# Patient Record
Sex: Male | Born: 1950
Health system: Southern US, Community
[De-identification: ages and names within clinical notes are randomized; demographics above are authoritative.]

## PROBLEM LIST (undated history)

## (undated) DIAGNOSIS — I10 Essential (primary) hypertension: Secondary | ICD-10-CM

## (undated) DIAGNOSIS — H40009 Preglaucoma, unspecified, unspecified eye: Secondary | ICD-10-CM

## (undated) DIAGNOSIS — H9319 Tinnitus, unspecified ear: Secondary | ICD-10-CM

## (undated) DIAGNOSIS — I2510 Atherosclerotic heart disease of native coronary artery without angina pectoris: Secondary | ICD-10-CM

## (undated) DIAGNOSIS — E559 Vitamin D deficiency, unspecified: Secondary | ICD-10-CM

## (undated) DIAGNOSIS — Z8719 Personal history of other diseases of the digestive system: Secondary | ICD-10-CM

## (undated) DIAGNOSIS — R972 Elevated prostate specific antigen [PSA]: Secondary | ICD-10-CM

## (undated) DIAGNOSIS — A159 Respiratory tuberculosis unspecified: Secondary | ICD-10-CM

## (undated) DIAGNOSIS — E785 Hyperlipidemia, unspecified: Secondary | ICD-10-CM

## (undated) DIAGNOSIS — Z905 Acquired absence of kidney: Secondary | ICD-10-CM

## (undated) HISTORY — DX: Elevated prostate specific antigen (PSA): R97.20

## (undated) HISTORY — DX: Hyperlipidemia, unspecified: E78.5

## (undated) HISTORY — DX: Acquired absence of kidney: Z90.5

## (undated) HISTORY — PX: UPPER GASTROINTESTINAL ENDOSCOPY: SHX188

## (undated) HISTORY — PX: COLONOSCOPY: SHX174

## (undated) HISTORY — DX: Vitamin D deficiency, unspecified: E55.9

## (undated) HISTORY — DX: Tinnitus, unspecified ear: H93.19

## (undated) HISTORY — DX: Preglaucoma, unspecified, unspecified eye: H40.009

## (undated) HISTORY — PX: OTHER SURGICAL HISTORY: SHX169

---
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
## 1978-05-02 HISTORY — PX: NEPHRECTOMY: SHX65

## 2003-11-23 ENCOUNTER — Inpatient Hospital Stay (HOSPITAL_COMMUNITY): Admission: EM | Admit: 2003-11-23 | Discharge: 2003-11-25 | Payer: Self-pay | Admitting: Emergency Medicine

## 2005-01-23 ENCOUNTER — Emergency Department (HOSPITAL_COMMUNITY): Admission: EM | Admit: 2005-01-23 | Discharge: 2005-01-23 | Payer: Self-pay | Admitting: Emergency Medicine

## 2009-12-22 ENCOUNTER — Observation Stay (HOSPITAL_COMMUNITY): Admission: EM | Admit: 2009-12-22 | Discharge: 2009-12-24 | Payer: Self-pay | Admitting: Emergency Medicine

## 2010-07-15 LAB — CK TOTAL AND CKMB (NOT AT ARMC)
Relative Index: 1.3 (ref 0.0–2.5)
Total CK: 280 U/L — ABNORMAL HIGH (ref 7–232)

## 2010-07-15 LAB — CBC
HCT: 45.8 % (ref 39.0–52.0)
MCH: 32.1 pg (ref 26.0–34.0)
MCHC: 35.2 g/dL (ref 30.0–36.0)
MCV: 91.2 fL (ref 78.0–100.0)
Platelets: 145 10*3/uL — ABNORMAL LOW (ref 150–400)
RBC: 5.03 MIL/uL (ref 4.22–5.81)
RDW: 13 % (ref 11.5–15.5)
RDW: 13.1 % (ref 11.5–15.5)

## 2010-07-15 LAB — CARDIAC PANEL(CRET KIN+CKTOT+MB+TROPI)
CK, MB: 2.6 ng/mL (ref 0.3–4.0)
Relative Index: 1.3 (ref 0.0–2.5)
Relative Index: 1.6 (ref 0.0–2.5)

## 2010-07-15 LAB — POCT CARDIAC MARKERS: CKMB, poc: 1.4 ng/mL (ref 1.0–8.0)

## 2010-07-15 LAB — BASIC METABOLIC PANEL
CO2: 30 mEq/L (ref 19–32)
Creatinine, Ser: 0.75 mg/dL (ref 0.4–1.5)
Glucose, Bld: 114 mg/dL — ABNORMAL HIGH (ref 70–99)
Sodium: 140 mEq/L (ref 135–145)

## 2010-07-15 LAB — APTT: aPTT: 26 seconds (ref 24–37)

## 2010-07-15 LAB — POCT I-STAT, CHEM 8
Calcium, Ion: 1.13 mmol/L (ref 1.12–1.32)
Chloride: 107 mEq/L (ref 96–112)
Creatinine, Ser: 0.8 mg/dL (ref 0.4–1.5)
HCT: 47 % (ref 39.0–52.0)
Hemoglobin: 16 g/dL (ref 13.0–17.0)
Sodium: 140 mEq/L (ref 135–145)
TCO2: 25 mmol/L (ref 0–100)

## 2010-07-15 LAB — HEPATIC FUNCTION PANEL
ALT: 33 U/L (ref 0–53)
Albumin: 3.7 g/dL (ref 3.5–5.2)
Alkaline Phosphatase: 55 U/L (ref 39–117)

## 2010-07-15 LAB — LIPID PANEL
Cholesterol: 192 mg/dL (ref 0–200)
HDL: 29 mg/dL — ABNORMAL LOW (ref >39)
Total CHOL/HDL Ratio: 6.6 RATIO
Triglycerides: 339 mg/dL — ABNORMAL HIGH (ref <150)

## 2010-07-15 LAB — HEMOGLOBIN A1C: Hgb A1c MFr Bld: 5.7 % — ABNORMAL HIGH (ref <5.7)

## 2010-07-15 LAB — D-DIMER, QUANTITATIVE: D-Dimer, Quant: 0.36 ug/mL-FEU (ref 0.00–0.48)

## 2010-07-15 LAB — TROPONIN I: Troponin I: 0.01 ng/mL (ref 0.00–0.06)
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
## 2013-04-27 ENCOUNTER — Ambulatory Visit (INDEPENDENT_AMBULATORY_CARE_PROVIDER_SITE_OTHER): Payer: 59 | Admitting: Emergency Medicine

## 2013-04-27 VITALS — BP 136/80 | HR 87 | Temp 98.1°F | Resp 16 | Ht 69.75 in | Wt 207.0 lb

## 2013-04-27 DIAGNOSIS — R05 Cough: Secondary | ICD-10-CM

## 2013-04-27 DIAGNOSIS — R52 Pain, unspecified: Secondary | ICD-10-CM

## 2013-04-27 LAB — POCT CBC
Granulocyte percent: 67.6 %G (ref 37–80)
MID (cbc): 0.5 (ref 0–0.9)
MPV: 10.6 fL (ref 0–99.8)
POC Granulocyte: 4.8 (ref 2–6.9)
POC MID %: 7.1 %M (ref 0–12)
Platelet Count, POC: 168 10*3/uL (ref 142–424)
RBC: 4.91 M/uL (ref 4.69–6.13)

## 2013-04-27 MED ORDER — OSELTAMIVIR PHOSPHATE 75 MG PO CAPS: 75.0000 mg | ORAL_CAPSULE | Freq: Two times a day (BID) | ORAL | Status: DC

## 2013-04-27 NOTE — Patient Instructions (Signed)

## 2013-04-27 NOTE — Progress Notes (Addendum)
Subjective:    Patient ID: Shane Fowler, male    DOB: June 13, 1950, 62 y.o.   MRN: [id number]  Cough Associated symptoms include chills, headaches and a sore throat. Pertinent negatives include no ear pain, fever, myalgias, rhinorrhea or shortness of breath.  Emesis  Associated symptoms include chills, coughing and headaches. Pertinent negatives include no diarrhea, fever or myalgias.   This chart was scribed for Collene Gobble, MD, by Ellin Mayhew, ED Scribe. This patient was seen in room 04 and the patient's care was started at 11:33 AM.  HPI Comments: Shane Fowler is a 62 y.o. male who presents to the [facility] complaining of progressively worsening influenza symptoms including a sore throat, hot flashes, HA and chills. He reports that he began feeling a sore throat as the initial symptom two days ago. He worked later that night outside, and states that the cold night worsened his symptoms. He has taken tamiflu with minimal relief. Patient explains that following that night, his symptoms have progressively worsened to include hot flashes and chills but no fever or myalgias. Patient also reports that he has been vomiting on and off for two days. After eating, he reports feeling worse and was unable to work yesterday. Patient reports having taken the flu-shot earlier this year. He is a non-smoker.  History reviewed. No pertinent past medical history.  History reviewed. No pertinent past surgical history.  History reviewed. No pertinent family history.  History   Social History  . Marital Status: Divorced    Spouse Name: N/A    Number of Children: N/A  . Years of Education: N/A   Occupational History  . Not on file.   Social History Main Topics  . Smoking status: Never Smoker   . Smokeless tobacco: Not on file  . Alcohol Use: Yes  . Drug Use: No  . Sexual Activity: Not on file   Other Topics Concern  . Not on file   Social History  Narrative  . No narrative on file    No Known Allergies  There are no active problems to display for this patient.   Results for orders placed during the hospital encounter of 12/22/09  MRSA PCR SCREENING      Result Value Range   MRSA by PCR    NEGATIVE   Value: NEGATIVE            The GeneXpert MRSA Assay (FDA     approved for NASAL specimens     only), is one component of a     comprehensive MRSA colonization     surveillance program. It is not     intended to diagnose MRSA     infection nor to guide or     monitor treatment for     MRSA infections.  CBC      Result Value Range   WBC 5.2  4.0 - 10.5 K/uL   RBC 5.03  4.22 - 5.81 MIL/uL   Hemoglobin 16.0  13.0 - 17.0 g/dL   HCT 78.2  95.6 - 21.3 %   MCV 89.5  78.0 - 100.0 fL   MCH 31.8  26.0 - 34.0 pg   MCHC 35.6  30.0 - 36.0 g/dL   RDW 08.6  57.8 - 46.9 %   Platelets 145 (*) 150 - 400 K/uL  CK TOTAL AND CKMB      Result Value Range   Total CK 280 (*) 7 - 232 U/L  CK, MB 3.5  0.3 - 4.0 ng/mL   Relative Index 1.3  0.0 - 2.5  HEPATIC FUNCTION PANEL      Result Value Range   Total Protein 7.3  6.0 - 8.3 g/dL   Albumin 3.7  3.5 - 5.2 g/dL   AST 33  0 - 37 U/L   ALT 33  0 - 53 U/L   Alkaline Phosphatase 55  39 - 117 U/L   Total Bilirubin 0.6  0.3 - 1.2 mg/dL   Bilirubin, Direct <1.6  0.0 - 0.3 mg/dL   Indirect Bilirubin NOT CALCULATED  0.3 - 0.9 mg/dL  HEMOGLOBIN X0R      Result Value Range   Hemoglobin A1C   (*) <5.7 %   Value: 5.7     (NOTE)                                                                       According to the ADA Clinical Practice Recommendations for 2011, when HbA1c is used as a screening test:   >=6.5%   Diagnostic of Diabetes Mellitus           (if abnormal result      is confirmed)  5.7-6.4%   Increased risk of developing Diabetes Mellitus  References:Diagnosis and Classification of Diabetes Mellitus,Diabetes Care,2011,34(Suppl 1):S62-S69 and Standards of Medical Care in         Diabetes -  2011,Diabetes Care,2011,34      (Suppl 1):S11-S61.   Mean Plasma Glucose 117 (*) <117 mg/dL  LIPASE, BLOOD      Result Value Range   Lipase 29  11 - 59 U/L  PROTIME-INR      Result Value Range   Prothrombin Time 13.4  11.6 - 15.2 seconds   INR 1.00  0.00 - 1.49  APTT      Result Value Range   aPTT 26  24 - 37 seconds  TROPONIN I      Result Value Range   Troponin I    0.00 - 0.06 ng/mL   Value: 0.01            NO INDICATION OF     MYOCARDIAL INJURY.  TSH      Result Value Range   TSH 1.604  0.350 - 4.500 uIU/mL  CARDIAC PANEL(CRET KIN+CKTOT+MB+TROPI)      Result Value Range   Total CK 202  7 - 232 U/L   CK, MB 2.6  0.3 - 4.0 ng/mL   Troponin I    0.00 - 0.06 ng/mL   Value: 0.01            NO INDICATION OF     MYOCARDIAL INJURY.   Relative Index 1.3  0.0 - 2.5  LIPID PANEL      Result Value Range   Cholesterol    0 - 200 mg/dL   Value: 604            ATP III CLASSIFICATION:      <200     mg/dL   Desirable      540-981  mg/dL   Borderline High      >=240    mg/dL   High  Triglycerides 339 (*) <150 mg/dL   HDL 29 (*) >09 mg/dL   Total CHOL/HDL Ratio 6.6     VLDL 68 (*) 0 - 40 mg/dL   LDL Cholesterol    0 - 99 mg/dL   Value: 95            Total Cholesterol/HDL:CHD Risk     Coronary Heart Disease Risk Table                         Men   Women      1/2 Average Risk   3.4   3.3      Average Risk       5.0   4.4      2 X Average Risk   9.6   7.1      3 X Average Risk  23.4   11.0                Use the calculated Patient Ratio     above and the CHD Risk Table     to determine the patient's CHD Risk.                ATP III CLASSIFICATION (LDL):      <100     mg/dL   Optimal      811-914  mg/dL   Near or Above                        Optimal      130-159  mg/dL   Borderline      782-956  mg/dL   High      >213     mg/dL   Very High  CARDIAC PANEL(CRET KIN+CKTOT+MB+TROPI)      Result Value Range   Total CK 176  7 - 232 U/L   CK, MB 2.8  0.3 - 4.0  ng/mL   Troponin I    0.00 - 0.06 ng/mL   Value: 0.02            NO INDICATION OF     MYOCARDIAL INJURY.   Relative Index 1.6  0.0 - 2.5  D-DIMER, QUANTITATIVE      Result Value Range   D-Dimer, Quant    0.00 - 0.48 ug/mL-FEU   Value: 0.36            AT THE INHOUSE ESTABLISHED CUTOFF     VALUE OF 0.48 ug/mL FEU,     THIS ASSAY HAS BEEN DOCUMENTED     IN THE LITERATURE TO HAVE     A SENSITIVITY AND NEGATIVE     PREDICTIVE VALUE OF AT LEAST     98 TO 99%.  THE TEST RESULT     SHOULD BE CORRELATED WITH     AN ASSESSMENT OF THE CLINICAL     PROBABILITY OF DVT / VTE.  BASIC METABOLIC PANEL      Result Value Range   Sodium 140  135 - 145 mEq/L   Potassium 3.8  3.5 - 5.1 mEq/L   Chloride 105  96 - 112 mEq/L   CO2 30  19 - 32 mEq/L   Glucose, Bld 114 (*) 70 - 99 mg/dL   BUN 11  6 - 23 mg/dL   Creatinine, Ser 0.86  0.4 - 1.5 mg/dL   Calcium 9.4  8.4 - 57.8 mg/dL   GFR calc non Af Amer >60  >60 mL/min  GFR calc Af Amer    >60 mL/min   Value: >60            The eGFR has been calculated     using the MDRD equation.     This calculation has not been     validated in all clinical     situations.     eGFR's persistently     <60 mL/min signify     possible Chronic Kidney Disease.  CBC      Result Value Range   WBC 6.1  4.0 - 10.5 K/uL   RBC 5.02  4.22 - 5.81 MIL/uL   Hemoglobin 16.1  13.0 - 17.0 g/dL   HCT 41.3  24.4 - 01.0 %   MCV 91.2  78.0 - 100.0 fL   MCH 32.1  26.0 - 34.0 pg   MCHC 35.2  30.0 - 36.0 g/dL   RDW 27.2  53.6 - 64.4 %   Platelets 148 (*) 150 - 400 K/uL  POCT I-STAT, CHEM 8      Result Value Range   Sodium 140  135 - 145 mEq/L   Potassium 4.0  3.5 - 5.1 mEq/L   Chloride 107  96 - 112 mEq/L   BUN 14  6 - 23 mg/dL   Creatinine, Ser 0.8  0.4 - 1.5 mg/dL   Glucose, Bld 034 (*) 70 - 99 mg/dL   Calcium, Ion 7.42  5.95 - 1.32 mmol/L   TCO2 25  0 - 100 mmol/L   Hemoglobin 16.0  13.0 - 17.0 g/dL   HCT 63.8  75.6 - 43.3 %  POCT CARDIAC MARKERS      Result Value  Range   Myoglobin, poc 91.3  12 - 200 ng/mL   CKMB, poc 1.6  1.0 - 8.0 ng/mL   Troponin i, poc <0.05  0.00 - 0.09 ng/mL   Comment       Value:            TROPONIN VALUES IN THE RANGE     OF 0.00-0.09 ng/mL SHOW     NO INDICATION OF     MYOCARDIAL INJURY.                PERSISTENTLY INCREASED TROPONIN     VALUES IN THE RANGE OF 0.10-0.24     ng/mL CAN BE SEEN IN:           -UNSTABLE ANGINA           -CONGESTIVE HEART FAILURE           -MYOCARDITIS           -CHEST TRAUMA           -ARRYHTHMIAS           -LATE PRESENTING MI           -COPD       CLINICAL FOLLOW-UP RECOMMENDED.                TROPONIN VALUES >=0.25 ng/mL     INDICATE POSSIBLE MYOCARDIAL     ISCHEMIA. SERIAL TESTING     RECOMMENDED.  POCT CARDIAC MARKERS      Result Value Range   Myoglobin, poc 63.5  12 - 200 ng/mL   CKMB, poc 1.4  1.0 - 8.0 ng/mL   Troponin i, poc <0.05  0.00 - 0.09 ng/mL   Comment       Value:            TROPONIN  VALUES IN THE RANGE     OF 0.00-0.09 ng/mL SHOW     NO INDICATION OF     MYOCARDIAL INJURY.                PERSISTENTLY INCREASED TROPONIN     VALUES IN THE RANGE OF 0.10-0.24     ng/mL CAN BE SEEN IN:           -UNSTABLE ANGINA           -CONGESTIVE HEART FAILURE           -MYOCARDITIS           -CHEST TRAUMA           -ARRYHTHMIAS           -LATE PRESENTING MI           -COPD       CLINICAL FOLLOW-UP RECOMMENDED.                TROPONIN VALUES >=0.25 ng/mL     INDICATE POSSIBLE MYOCARDIAL     ISCHEMIA. SERIAL TESTING     RECOMMENDED.    No diagnosis found.  No orders of the defined types were placed in this encounter.    BP 136/80  Pulse 87  Temp(Src) 98.1 F (36.7 C) (Oral)  Resp 16  Ht 5' 9.75" (1.772 m)  Wt 207 lb (93.895 kg)  BMI 29.90 kg/m2  SpO2 96%  Review of Systems  Constitutional: Positive for chills and diaphoresis. Negative for fever.  HENT: Positive for congestion and sore throat. Negative for ear pain, rhinorrhea and sinus pressure.     Respiratory: Positive for cough. Negative for shortness of breath.   Gastrointestinal: Positive for nausea and vomiting. Negative for diarrhea.  Musculoskeletal: Negative for myalgias and neck pain.  Neurological: Positive for headaches. Negative for weakness and light-headedness.  A complete 10 system review of systems was obtained and all systems are negative except as noted in the HPI and PMH.   Objective:   Physical Exam  CONSTITUTIONAL: Well developed/well nourished HEAD: Normocephalic/atraumatic EYES: EOMI/PERRL ENMT: Oropharyngeal erythema without exudate noted. Ears normal. NECK: supple no meningeal signs. No nodes. SPINE:entire spine nontender CV: S1/S2 noted, no murmurs/rubs/gallops noted LUNGS: Lungs are clear to auscultation bilaterally, no apparent distress ABDOMEN: soft, nontender, no rebound or guarding GU:no cva tenderness NEURO: Pt is awake/alert, moves all extremitiesx4 EXTREMITIES: pulses normal, full ROM SKIN: warm, color normal PSYCH: no abnormalities of mood noted Results for orders placed in visit on 04/27/13  POCT INFLUENZA A/B      Result Value Range   Influenza A, POC Negative     Influenza B, POC Negative     Results for orders placed in visit on 04/27/13  POCT INFLUENZA A/B      Result Value Range   Influenza A, POC Negative     Influenza B, POC Negative    POCT CBC      Result Value Range   WBC 7.1  4.6 - 10.2 K/uL   Lymph, poc 1.8  0.6 - 3.4   POC LYMPH PERCENT 25.3  10 - 50 %L   MID (cbc) 0.5  0 - 0.9   POC MID % 7.1  0 - 12 %M   POC Granulocyte 4.8  2 - 6.9   Granulocyte percent 67.6  37 - 80 %G   RBC 4.91  4.69 - 6.13 M/uL   Hemoglobin 15.1  14.1 - 18.1 g/dL   HCT, POC 46.9  43.5 - 53.7 %   MCV 95.5  80 - 97 fL   MCH, POC 30.8  27 - 31.2 pg   MCHC 32.2  31.8 - 35.4 g/dL   RDW, POC 16.1     Platelet Count, POC 168  142 - 424 K/uL   MPV 10.6  0 - 99.8 fL   Assessment & Plan:   Patient has a normal white count with all flu  symptoms despite a negative flu swab. We'll treat with Tamiflu I personally performed the services described in this documentation, which was scribed in my presence. The recorded information has been reviewed and is accurate.

## 2015-11-30 LAB — HM HIV SCREENING LAB: HM HIV Screening: NEGATIVE

## 2015-11-30 LAB — HM HEPATITIS C SCREENING LAB: HM Hepatitis Screen: NEGATIVE

## 2016-11-22 LAB — MICROALBUMIN, URINE: Microalb, Ur: 3.46

## 2017-07-12 LAB — HM COLONOSCOPY

## 2018-01-06 DIAGNOSIS — L84 Corns and callosities: Secondary | ICD-10-CM | POA: Diagnosis not present

## 2018-01-23 LAB — BASIC METABOLIC PANEL
BUN: 17 (ref 4–21)
Creatinine: 0.9 (ref 0.6–1.3)
Glucose: 96
Potassium: 4 (ref 3.4–5.3)
Sodium: 139 (ref 137–147)

## 2018-01-23 LAB — HEPATIC FUNCTION PANEL
ALT: 33 (ref 10–40)
AST: 26 (ref 14–40)
Alkaline Phosphatase: 66 (ref 25–125)
Bilirubin, Direct: 0.1 (ref 0.01–0.4)
Bilirubin, Total: 0.6

## 2018-01-23 LAB — LIPID PANEL
Cholesterol: 188 (ref 0–200)
HDL: 30 — AB (ref 35–70)
LDL Cholesterol: 107
Triglycerides: 257 — AB (ref 40–160)

## 2018-01-23 LAB — CBC AND DIFFERENTIAL
HCT: 44 (ref 41–53)
Hemoglobin: 15.1 (ref 13.5–17.5)
Neutrophils Absolute: 1
Platelets: 164 (ref 150–399)
WBC: 5.3

## 2018-01-23 LAB — VITAMIN D 25 HYDROXY (VIT D DEFICIENCY, FRACTURES): Vit D, 25-Hydroxy: 11.68

## 2018-01-23 LAB — PSA: PSA: 4.62

## 2018-08-14 DIAGNOSIS — M79672 Pain in left foot: Secondary | ICD-10-CM | POA: Diagnosis not present

## 2018-08-14 DIAGNOSIS — B351 Tinea unguium: Secondary | ICD-10-CM | POA: Diagnosis not present

## 2018-08-14 DIAGNOSIS — M79671 Pain in right foot: Secondary | ICD-10-CM | POA: Diagnosis not present

## 2018-08-14 DIAGNOSIS — L84 Corns and callosities: Secondary | ICD-10-CM | POA: Diagnosis not present

## 2018-09-13 DIAGNOSIS — M5432 Sciatica, left side: Secondary | ICD-10-CM | POA: Diagnosis not present

## 2018-09-13 DIAGNOSIS — H9313 Tinnitus, bilateral: Secondary | ICD-10-CM | POA: Diagnosis not present

## 2018-09-20 ENCOUNTER — Encounter: Payer: Self-pay | Admitting: Internal Medicine

## 2018-09-20 ENCOUNTER — Ambulatory Visit (INDEPENDENT_AMBULATORY_CARE_PROVIDER_SITE_OTHER): Payer: Medicare Other | Admitting: Internal Medicine

## 2018-09-20 DIAGNOSIS — H9319 Tinnitus, unspecified ear: Secondary | ICD-10-CM

## 2018-09-20 DIAGNOSIS — R51 Headache: Secondary | ICD-10-CM

## 2018-09-20 DIAGNOSIS — R519 Headache, unspecified: Secondary | ICD-10-CM
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
## 2018-09-21 ENCOUNTER — Encounter: Payer: Self-pay | Admitting: Internal Medicine

## 2018-09-21 ENCOUNTER — Ambulatory Visit (INDEPENDENT_AMBULATORY_CARE_PROVIDER_SITE_OTHER): Payer: Medicare Other | Admitting: Internal Medicine

## 2018-09-21 ENCOUNTER — Other Ambulatory Visit: Payer: Self-pay

## 2018-09-21 VITALS — BP 121/74 | HR 67 | Temp 98.2°F | Resp 16 | Ht 70.0 in | Wt 204.0 lb

## 2018-09-21 DIAGNOSIS — R519 Headache, unspecified: Secondary | ICD-10-CM | POA: Insufficient documentation

## 2018-09-21 DIAGNOSIS — G4452 New daily persistent headache (NDPH): Secondary | ICD-10-CM | POA: Diagnosis not present

## 2018-09-21 DIAGNOSIS — E782 Mixed hyperlipidemia: Secondary | ICD-10-CM | POA: Insufficient documentation

## 2018-09-21 DIAGNOSIS — E785 Hyperlipidemia, unspecified: Secondary | ICD-10-CM

## 2018-09-21 DIAGNOSIS — I10 Essential (primary) hypertension: Secondary | ICD-10-CM | POA: Diagnosis not present

## 2018-09-21 LAB — COMPREHENSIVE METABOLIC PANEL
ALT: 21 U/L (ref 0–53)
AST: 17 U/L (ref 0–37)
Albumin: 4.2 g/dL (ref 3.5–5.2)
Alkaline Phosphatase: 59 U/L (ref 39–117)
BUN: 28 mg/dL — ABNORMAL HIGH (ref 6–23)
CO2: 27 mEq/L (ref 19–32)
Calcium: 9.3 mg/dL (ref 8.4–10.5)
Chloride: 104 mEq/L (ref 96–112)
Creatinine, Ser: 0.87 mg/dL (ref 0.40–1.50)
GFR: 87.82 mL/min (ref >60.00)
Glucose, Bld: 102 mg/dL — ABNORMAL HIGH (ref 70–99)
Potassium: 4.2 mEq/L (ref 3.5–5.1)
Sodium: 140 mEq/L (ref 135–145)
Total Bilirubin: 0.4 mg/dL (ref 0.2–1.2)
Total Protein: 7.3 g/dL (ref 6.0–8.3)

## 2018-09-21 LAB — CBC WITH DIFFERENTIAL/PLATELET
Basophils Absolute: 0.1 10*3/uL (ref 0.0–0.1)
Basophils Relative: 0.8 % (ref 0.0–3.0)
Eosinophils Absolute: 0.1 10*3/uL (ref 0.0–0.7)
Eosinophils Relative: 1.9 % (ref 0.0–5.0)
HCT: 43.6 % (ref 39.0–52.0)
Hemoglobin: 15.1 g/dL (ref 13.0–17.0)
Lymphocytes Relative: 19.7 % (ref 12.0–46.0)
Lymphs Abs: 1.4 10*3/uL (ref 0.7–4.0)
MCHC: 34.5 g/dL (ref 30.0–36.0)
MCV: 89.1 fl (ref 78.0–100.0)
Monocytes Absolute: 0.7 10*3/uL (ref 0.1–1.0)
Monocytes Relative: 9.5 % (ref 3.0–12.0)
Neutro Abs: 4.7 10*3/uL (ref 1.4–7.7)
Neutrophils Relative %: 68.1 % (ref 43.0–77.0)
Platelets: 178 10*3/uL (ref 150.0–400.0)
RBC: 4.9 Mil/uL (ref 4.22–5.81)
RDW: 14.2 % (ref 11.5–15.5)
WBC: 6.9 10*3/uL (ref 4.0–10.5)

## 2018-09-21 LAB — SEDIMENTATION RATE: Sed Rate: 31 mm/hr — ABNORMAL HIGH (ref 0–20)

## 2018-09-21 LAB — TSH: TSH: 1.77 u[IU]/mL (ref 0.35–4.50)

## 2018-09-21 NOTE — Patient Instructions (Signed)
GO TO THE LAB : Get the blood work     GO TO THE FRONT DESK Schedule your next appointment  For a check up in 4 weeks  Call in 2 weeks, let me know how you are doing
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
## 2018-09-21 NOTE — Progress Notes (Signed)
Pre visit review using our clinic review tool, if applicable. No additional management support is needed unless otherwise documented below in the visit note. 

## 2018-10-03 ENCOUNTER — Ambulatory Visit: Payer: Self-pay | Admitting: Internal Medicine

## 2018-10-04 DIAGNOSIS — H9319 Tinnitus, unspecified ear: Secondary | ICD-10-CM | POA: Diagnosis not present

## 2018-10-04 DIAGNOSIS — J310 Chronic rhinitis: Secondary | ICD-10-CM | POA: Diagnosis not present

## 2018-10-04 DIAGNOSIS — H9313 Tinnitus, bilateral: Secondary | ICD-10-CM | POA: Diagnosis not present

## 2018-10-04 DIAGNOSIS — H903 Sensorineural hearing loss, bilateral: Secondary | ICD-10-CM | POA: Diagnosis not present

## 2018-10-05 ENCOUNTER — Ambulatory Visit: Payer: Self-pay | Admitting: *Deleted

## 2018-10-05 ENCOUNTER — Ambulatory Visit (INDEPENDENT_AMBULATORY_CARE_PROVIDER_SITE_OTHER): Payer: Medicare Other | Admitting: Internal Medicine

## 2018-10-05 ENCOUNTER — Other Ambulatory Visit: Payer: Self-pay

## 2018-10-05 DIAGNOSIS — G4452 New daily persistent headache (NDPH): Secondary | ICD-10-CM | POA: Diagnosis not present

## 2018-10-05 DIAGNOSIS — H9319 Tinnitus, unspecified ear: Secondary | ICD-10-CM
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
## 2018-10-05 NOTE — Telephone Encounter (Signed)
Scheduled virtual visit with pcp today to follow up on ENT referral.

## 2018-10-05 NOTE — Telephone Encounter (Signed)
Call placed to patient. Left VM to return call to office 

## 2018-10-07 DIAGNOSIS — Z09 Encounter for follow-up examination after completed treatment for conditions other than malignant neoplasm: Secondary | ICD-10-CM | POA: Insufficient documentation

## 2018-10-07 NOTE — Assessment & Plan Note (Signed)
Tinnitus: Saw ENT and audiology, he was diagnosed with decreased hearing.  In the future was told might benefit from hearing aids.  ENT eval was benign, no further treatment or investigation was suggested. Headache: Better in the context of stopping Lamisil but also taking penicillin and hydrocortisone for an unrelated issue.  Recommend to call anytime if the headache resurface.  Labs from last visit were essentially normal. RTC as scheduled for a physical in few weeks

## 2018-10-19 ENCOUNTER — Ambulatory Visit (INDEPENDENT_AMBULATORY_CARE_PROVIDER_SITE_OTHER): Payer: Medicare Other | Admitting: Internal Medicine

## 2018-10-19 ENCOUNTER — Encounter: Payer: Self-pay | Admitting: Internal Medicine

## 2018-10-19 ENCOUNTER — Other Ambulatory Visit: Payer: Self-pay

## 2018-10-19 VITALS — BP 132/73 | HR 73 | Temp 98.0°F | Resp 16 | Ht 70.0 in | Wt 199.1 lb

## 2018-10-19 DIAGNOSIS — Z0000 Encounter for general adult medical examination without abnormal findings: Secondary | ICD-10-CM

## 2018-10-19 DIAGNOSIS — N400 Enlarged prostate without lower urinary tract symptoms: Secondary | ICD-10-CM

## 2018-10-19 DIAGNOSIS — I10 Essential (primary) hypertension: Secondary | ICD-10-CM

## 2018-10-19 DIAGNOSIS — R739 Hyperglycemia, unspecified: Secondary | ICD-10-CM | POA: Diagnosis not present

## 2018-10-19 DIAGNOSIS — M79605 Pain in left leg: Secondary | ICD-10-CM

## 2018-10-19 DIAGNOSIS — E785 Hyperlipidemia, unspecified: Secondary | ICD-10-CM

## 2018-10-19 HISTORY — DX: Encounter for general adult medical examination without abnormal findings: Z00.00

## 2018-10-19 LAB — HEMOGLOBIN A1C: Hgb A1c MFr Bld: 6.2 % (ref 4.6–6.5)

## 2018-10-19 LAB — LDL CHOLESTEROL, DIRECT: Direct LDL: 104 mg/dL

## 2018-10-19 LAB — LIPID PANEL
Cholesterol: 187 mg/dL (ref 0–200)
HDL: 31.6 mg/dL — ABNORMAL LOW (ref >39.00)
NonHDL: 155.76
Total CHOL/HDL Ratio: 6
Triglycerides: 260 mg/dL — ABNORMAL HIGH (ref 0.0–149.0)
VLDL: 52 mg/dL — ABNORMAL HIGH (ref 0.0–40.0)

## 2018-10-19 LAB — PSA: PSA: 4.4 ng/mL — ABNORMAL HIGH (ref 0.10–4.00)

## 2018-10-19 NOTE — Assessment & Plan Note (Addendum)
immunizations at [facility], recommend strongly if he has a flu shot this year. CCS: Cscope done ~ 2018 per pt at [facility] No PSA in years per pt.  DRE today consistent with BPH, no symptoms, check a PSA. Patient will obtain records from the [place].
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
## 2018-10-19 NOTE — Progress Notes (Signed)
Pre visit review using our clinic review tool, if applicable. No additional management support is needed unless otherwise documented below in the visit note. 

## 2018-10-19 NOTE — Patient Instructions (Signed)
Get the blood work     [place] Schedule your next appointment a checkup in 8 to 10 months  Get a flu shot this season  Check the  blood pressure 2 or 3 times a month  GOAL  is between 110/65 and  135/85.   If it is consistently higher or lower, let me know

## 2018-10-21 DIAGNOSIS — N400 Enlarged prostate without lower urinary tract symptoms: Secondary | ICD-10-CM | POA: Insufficient documentation

## 2018-10-21 HISTORY — DX: Benign prostatic hyperplasia without lower urinary tract symptoms: N40.0

## 2018-10-21 NOTE — Assessment & Plan Note (Addendum)
HTN: Seems well controlled, continue lisinopril.  Last BMP satisfactory Hyperlipidemia: On atorvastatin and Lovaza, last LFTs normal, check a FLP Hyperglycemia: Had an A1c years ago, 5.7, recheck A1c Pain, left leg: As described above, observation for now, pain is a sporadic BPH: new dx today . Checking a PSA, no symptoms RTC 8 to 10 months

## 2018-10-23 ENCOUNTER — Encounter: Payer: Self-pay | Admitting: Internal Medicine

## 2018-10-25 ENCOUNTER — Encounter: Payer: Self-pay | Admitting: Internal Medicine

## 2018-10-25 DIAGNOSIS — R972 Elevated prostate specific antigen [PSA]: Secondary | ICD-10-CM

## 2018-10-29 ENCOUNTER — Telehealth: Payer: Self-pay

## 2018-10-29 NOTE — Telephone Encounter (Signed)
Pt dropped off medical records from [place]. Placed in PCP red folder.

## 2018-11-12 DIAGNOSIS — M79671 Pain in right foot: Secondary | ICD-10-CM | POA: Diagnosis not present

## 2018-11-12 DIAGNOSIS — M79672 Pain in left foot: Secondary | ICD-10-CM | POA: Diagnosis not present

## 2018-11-12 DIAGNOSIS — B351 Tinea unguium: Secondary | ICD-10-CM | POA: Diagnosis not present

## 2018-11-13 NOTE — Telephone Encounter (Signed)
About 100 pages reviewed, many are redundant/repetitive, will keep only the most relevant ones  History of vitamin D deficiency: Low vitamin D: January 23, 2018  PSA: 4.5 on 11/30/2015 PSA: 4.4 on 03/01/2016 PSA: 4.2 on 01/23/2018 At some point recommended a prostate biopsy, apparently evaluated by [facility] urology.  Surgical pathology: From 06/13/2016: Colon polyps, multiple: Hyperplastic, tubular adenoma, hyperplastic, benign colonic mucosa. Next 2022  Hemoglobin A1c 01/23/2017: 5.8  Allergic to nitroglycerin?     No DM retinopathy as of 2020 Glaucoma suspect

## 2018-12-05 DIAGNOSIS — N402 Nodular prostate without lower urinary tract symptoms: Secondary | ICD-10-CM | POA: Diagnosis not present

## 2018-12-05 DIAGNOSIS — R972 Elevated prostate specific antigen [PSA]: Secondary | ICD-10-CM | POA: Diagnosis not present

## 2018-12-05 DIAGNOSIS — N5201 Erectile dysfunction due to arterial insufficiency: Secondary | ICD-10-CM | POA: Diagnosis not present

## 2019-01-14 ENCOUNTER — Encounter: Payer: Self-pay | Admitting: Internal Medicine

## 2019-01-15 ENCOUNTER — Other Ambulatory Visit: Payer: Self-pay

## 2019-01-16 ENCOUNTER — Encounter: Payer: Self-pay | Admitting: Internal Medicine

## 2019-01-16 ENCOUNTER — Ambulatory Visit (INDEPENDENT_AMBULATORY_CARE_PROVIDER_SITE_OTHER): Payer: Medicare Other | Admitting: Internal Medicine

## 2019-01-16 VITALS — BP 122/70 | HR 73 | Temp 96.9°F | Resp 18 | Wt 198.4 lb

## 2019-01-16 DIAGNOSIS — M5430 Sciatica, unspecified side: Secondary | ICD-10-CM | POA: Diagnosis not present

## 2019-01-16 MED ORDER — PREDNISONE 10 MG PO TABS: ORAL_TABLET | ORAL | 0 refills | Status: DC

## 2019-01-16 MED ORDER — CYCLOBENZAPRINE HCL 10 MG PO TABS: 10.0000 mg | ORAL_TABLET | Freq: Two times a day (BID) | ORAL | 0 refills | Status: DC | PRN
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
## 2019-01-16 NOTE — Patient Instructions (Signed)
Take prednisone as prescribed for few days.  Take the muscle relaxant cyclobenzaprine twice a day as needed.  If it caused too much drowsiness, take it only at night  Tylenol  500 mg OTC 2 tabs a day every 8 hours as needed for pain  Use warm compresses at the lower back  We are referring you to physical therapy.  Please call if not gradually better.  Please call if you are worse or if the medication is not helping.

## 2019-01-17 NOTE — Assessment & Plan Note (Signed)
Lumbalgia: Going on for few months (?), worse for the last 2 weeks, no recent unusual heavy lifting. He does have some radicular features. Plan: Round of prednisone, Flexeril, watch for drowsiness.  I recommend Tylenol over ibuprofen. Refer to PT, he is noted to be very stiff when I did the straight leg test. Definitely call if not improving, Ortho referral?. Elevated PSA: Saw urology, scheduled for a biopsy by the end of October. Declining flu shot, benefits discussed.

## 2019-01-21 ENCOUNTER — Ambulatory Visit: Payer: Medicare Other | Attending: Internal Medicine | Admitting: Physical Therapy

## 2019-01-31 ENCOUNTER — Ambulatory Visit: Payer: Medicare Other | Admitting: Physical Therapy

## 2019-02-08 ENCOUNTER — Ambulatory Visit: Payer: Medicare Other | Attending: Internal Medicine | Admitting: Physical Therapy

## 2019-02-08 ENCOUNTER — Encounter: Payer: Self-pay | Admitting: Physical Therapy

## 2019-02-08 ENCOUNTER — Other Ambulatory Visit: Payer: Self-pay

## 2019-02-08 DIAGNOSIS — G8929 Other chronic pain: Secondary | ICD-10-CM | POA: Diagnosis not present

## 2019-02-08 DIAGNOSIS — R262 Difficulty in walking, not elsewhere classified: Secondary | ICD-10-CM | POA: Diagnosis not present

## 2019-02-08 DIAGNOSIS — M6281 Muscle weakness (generalized): Secondary | ICD-10-CM | POA: Insufficient documentation

## 2019-02-08 DIAGNOSIS — M5442 Lumbago with sciatica, left side: Secondary | ICD-10-CM | POA: Insufficient documentation
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
## 2019-02-15 ENCOUNTER — Encounter: Payer: Medicare Other | Admitting: Physical Therapy

## 2019-02-18 ENCOUNTER — Encounter: Payer: Medicare Other | Admitting: Physical Therapy

## 2019-02-18 DIAGNOSIS — B351 Tinea unguium: Secondary | ICD-10-CM | POA: Diagnosis not present

## 2019-02-18 DIAGNOSIS — M79672 Pain in left foot: Secondary | ICD-10-CM | POA: Diagnosis not present

## 2019-02-18 DIAGNOSIS — M79671 Pain in right foot: Secondary | ICD-10-CM | POA: Diagnosis not present

## 2019-02-22 ENCOUNTER — Other Ambulatory Visit: Payer: Self-pay

## 2019-02-22 ENCOUNTER — Ambulatory Visit: Payer: Medicare Other

## 2019-02-22 DIAGNOSIS — G8929 Other chronic pain: Secondary | ICD-10-CM | POA: Diagnosis not present

## 2019-02-22 DIAGNOSIS — R262 Difficulty in walking, not elsewhere classified: Secondary | ICD-10-CM

## 2019-02-22 DIAGNOSIS — M5442 Lumbago with sciatica, left side: Secondary | ICD-10-CM | POA: Diagnosis not present

## 2019-02-22 DIAGNOSIS — M6281 Muscle weakness (generalized): Secondary | ICD-10-CM
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
## 2019-02-25 ENCOUNTER — Encounter: Payer: Medicare Other | Admitting: Physical Therapy

## 2019-02-25 DIAGNOSIS — R972 Elevated prostate specific antigen [PSA]: Secondary | ICD-10-CM | POA: Diagnosis not present

## 2019-02-25 HISTORY — PX: PROSTATE BIOPSY: SHX241

## 2019-02-28 DIAGNOSIS — R972 Elevated prostate specific antigen [PSA]: Secondary | ICD-10-CM | POA: Diagnosis not present

## 2019-02-28 DIAGNOSIS — N5201 Erectile dysfunction due to arterial insufficiency: Secondary | ICD-10-CM | POA: Diagnosis not present

## 2019-02-28 DIAGNOSIS — N402 Nodular prostate without lower urinary tract symptoms: Secondary | ICD-10-CM | POA: Diagnosis not present

## 2019-03-01 ENCOUNTER — Other Ambulatory Visit: Payer: Self-pay

## 2019-03-01 ENCOUNTER — Ambulatory Visit: Payer: Medicare Other

## 2019-03-01 DIAGNOSIS — R262 Difficulty in walking, not elsewhere classified: Secondary | ICD-10-CM

## 2019-03-01 DIAGNOSIS — M6281 Muscle weakness (generalized): Secondary | ICD-10-CM

## 2019-03-01 DIAGNOSIS — M5442 Lumbago with sciatica, left side: Secondary | ICD-10-CM

## 2019-03-01 DIAGNOSIS — G8929 Other chronic pain: Secondary | ICD-10-CM
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
## 2019-03-04 ENCOUNTER — Other Ambulatory Visit: Payer: Self-pay

## 2019-03-04 ENCOUNTER — Encounter: Payer: Self-pay | Admitting: Physical Therapy

## 2019-03-04 ENCOUNTER — Ambulatory Visit: Payer: Medicare Other | Attending: Internal Medicine | Admitting: Physical Therapy

## 2019-03-04 DIAGNOSIS — G8929 Other chronic pain: Secondary | ICD-10-CM | POA: Diagnosis not present

## 2019-03-04 DIAGNOSIS — M6281 Muscle weakness (generalized): Secondary | ICD-10-CM | POA: Diagnosis not present

## 2019-03-04 DIAGNOSIS — R262 Difficulty in walking, not elsewhere classified: Secondary | ICD-10-CM | POA: Insufficient documentation

## 2019-03-04 DIAGNOSIS — M5442 Lumbago with sciatica, left side: Secondary | ICD-10-CM | POA: Insufficient documentation
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
## 2019-03-08 ENCOUNTER — Other Ambulatory Visit: Payer: Self-pay

## 2019-03-08 ENCOUNTER — Ambulatory Visit: Payer: Medicare Other

## 2019-03-08 DIAGNOSIS — R262 Difficulty in walking, not elsewhere classified: Secondary | ICD-10-CM

## 2019-03-08 DIAGNOSIS — G8929 Other chronic pain: Secondary | ICD-10-CM | POA: Diagnosis not present

## 2019-03-08 DIAGNOSIS — M6281 Muscle weakness (generalized): Secondary | ICD-10-CM

## 2019-03-08 DIAGNOSIS — M5442 Lumbago with sciatica, left side: Secondary | ICD-10-CM | POA: Diagnosis not present
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
## 2019-03-18 ENCOUNTER — Other Ambulatory Visit: Payer: Self-pay

## 2019-03-18 ENCOUNTER — Ambulatory Visit: Payer: Medicare Other

## 2019-03-18 VITALS — BP 146/72 | HR 74

## 2019-03-18 DIAGNOSIS — M6281 Muscle weakness (generalized): Secondary | ICD-10-CM | POA: Diagnosis not present

## 2019-03-18 DIAGNOSIS — M5442 Lumbago with sciatica, left side: Secondary | ICD-10-CM | POA: Diagnosis not present

## 2019-03-18 DIAGNOSIS — R262 Difficulty in walking, not elsewhere classified: Secondary | ICD-10-CM

## 2019-03-18 DIAGNOSIS — G8929 Other chronic pain: Secondary | ICD-10-CM | POA: Diagnosis not present
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
## 2019-03-21 ENCOUNTER — Encounter: Payer: Medicare Other | Admitting: Physical Therapy

## 2019-03-21 ENCOUNTER — Encounter: Payer: Self-pay | Admitting: Physical Therapy

## 2019-03-21 ENCOUNTER — Other Ambulatory Visit: Payer: Self-pay

## 2019-03-21 ENCOUNTER — Ambulatory Visit: Payer: Medicare Other | Admitting: Physical Therapy

## 2019-03-21 VITALS — BP 110/70 | HR 74

## 2019-03-21 DIAGNOSIS — M6281 Muscle weakness (generalized): Secondary | ICD-10-CM

## 2019-03-21 DIAGNOSIS — R262 Difficulty in walking, not elsewhere classified: Secondary | ICD-10-CM

## 2019-03-21 DIAGNOSIS — M5442 Lumbago with sciatica, left side: Secondary | ICD-10-CM | POA: Diagnosis not present

## 2019-03-21 DIAGNOSIS — G8929 Other chronic pain: Secondary | ICD-10-CM | POA: Diagnosis not present
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
## 2019-03-25 ENCOUNTER — Ambulatory Visit: Payer: Medicare Other | Admitting: Physical Therapy

## 2019-03-25 ENCOUNTER — Other Ambulatory Visit: Payer: Self-pay

## 2019-03-25 ENCOUNTER — Encounter: Payer: Self-pay | Admitting: Physical Therapy

## 2019-03-25 VITALS — BP 121/70 | HR 65

## 2019-03-25 DIAGNOSIS — R262 Difficulty in walking, not elsewhere classified: Secondary | ICD-10-CM

## 2019-03-25 DIAGNOSIS — G8929 Other chronic pain: Secondary | ICD-10-CM

## 2019-03-25 DIAGNOSIS — M6281 Muscle weakness (generalized): Secondary | ICD-10-CM | POA: Diagnosis not present

## 2019-03-25 DIAGNOSIS — M5442 Lumbago with sciatica, left side: Secondary | ICD-10-CM
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
## 2019-03-27 ENCOUNTER — Encounter: Payer: Self-pay | Admitting: Physical Therapy

## 2019-03-27 ENCOUNTER — Ambulatory Visit: Payer: Medicare Other | Admitting: Physical Therapy

## 2019-03-27 ENCOUNTER — Other Ambulatory Visit: Payer: Self-pay

## 2019-03-27 VITALS — BP 120/72 | HR 67

## 2019-03-27 DIAGNOSIS — R262 Difficulty in walking, not elsewhere classified: Secondary | ICD-10-CM

## 2019-03-27 DIAGNOSIS — M6281 Muscle weakness (generalized): Secondary | ICD-10-CM | POA: Diagnosis not present

## 2019-03-27 DIAGNOSIS — G8929 Other chronic pain: Secondary | ICD-10-CM

## 2019-03-27 DIAGNOSIS — M5442 Lumbago with sciatica, left side: Secondary | ICD-10-CM | POA: Diagnosis not present
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
## 2019-05-20 DIAGNOSIS — B351 Tinea unguium: Secondary | ICD-10-CM | POA: Diagnosis not present

## 2019-05-20 DIAGNOSIS — M79671 Pain in right foot: Secondary | ICD-10-CM | POA: Diagnosis not present

## 2019-05-20 DIAGNOSIS — M79672 Pain in left foot: Secondary | ICD-10-CM | POA: Diagnosis not present

## 2019-08-19 ENCOUNTER — Ambulatory Visit: Payer: Medicare Other | Admitting: Internal Medicine

## 2019-08-19 DIAGNOSIS — M79672 Pain in left foot: Secondary | ICD-10-CM | POA: Diagnosis not present

## 2019-08-19 DIAGNOSIS — M79671 Pain in right foot: Secondary | ICD-10-CM | POA: Diagnosis not present

## 2019-08-19 DIAGNOSIS — B351 Tinea unguium: Secondary | ICD-10-CM | POA: Diagnosis not present

## 2019-08-21 ENCOUNTER — Encounter: Payer: Self-pay | Admitting: Internal Medicine

## 2019-08-21 ENCOUNTER — Other Ambulatory Visit: Payer: Self-pay

## 2019-08-21 ENCOUNTER — Ambulatory Visit (INDEPENDENT_AMBULATORY_CARE_PROVIDER_SITE_OTHER): Payer: Medicare Other | Admitting: Internal Medicine

## 2019-08-21 ENCOUNTER — Encounter (HOSPITAL_COMMUNITY): Payer: Self-pay | Admitting: *Deleted

## 2019-08-21 VITALS — BP 122/70 | HR 68 | Temp 97.4°F | Resp 18 | Ht 70.0 in | Wt 209.1 lb

## 2019-08-21 DIAGNOSIS — R079 Chest pain, unspecified: Secondary | ICD-10-CM

## 2019-08-21 DIAGNOSIS — R739 Hyperglycemia, unspecified: Secondary | ICD-10-CM

## 2019-08-21 DIAGNOSIS — I10 Essential (primary) hypertension: Secondary | ICD-10-CM | POA: Diagnosis not present

## 2019-08-21 DIAGNOSIS — E785 Hyperlipidemia, unspecified: Secondary | ICD-10-CM

## 2019-08-21 LAB — COMPREHENSIVE METABOLIC PANEL
ALT: 29 U/L (ref 0–53)
AST: 24 U/L (ref 0–37)
Albumin: 4.5 g/dL (ref 3.5–5.2)
Alkaline Phosphatase: 59 U/L (ref 39–117)
BUN: 25 mg/dL — ABNORMAL HIGH (ref 6–23)
CO2: 32 mEq/L (ref 19–32)
Calcium: 9.2 mg/dL (ref 8.4–10.5)
Chloride: 102 mEq/L (ref 96–112)
Creatinine, Ser: 0.89 mg/dL (ref 0.40–1.50)
GFR: 85.3 mL/min (ref >60.00)
Glucose, Bld: 111 mg/dL — ABNORMAL HIGH (ref 70–99)
Potassium: 4.1 mEq/L (ref 3.5–5.1)
Sodium: 140 mEq/L (ref 135–145)
Total Bilirubin: 0.5 mg/dL (ref 0.2–1.2)
Total Protein: 7.4 g/dL (ref 6.0–8.3)

## 2019-08-21 LAB — LIPID PANEL
Cholesterol: 208 mg/dL — ABNORMAL HIGH (ref 0–200)
HDL: 32.4 mg/dL — ABNORMAL LOW (ref >39.00)
NonHDL: 175.26
Total CHOL/HDL Ratio: 6
Triglycerides: 302 mg/dL — ABNORMAL HIGH (ref 0.0–149.0)
VLDL: 60.4 mg/dL — ABNORMAL HIGH (ref 0.0–40.0)

## 2019-08-21 LAB — LDL CHOLESTEROL, DIRECT: Direct LDL: 107 mg/dL

## 2019-08-21 LAB — HEMOGLOBIN A1C: Hgb A1c MFr Bld: 6.1 % (ref 4.6–6.5)

## 2019-08-21 NOTE — Progress Notes (Signed)
Pre visit review using our clinic review tool, if applicable. No additional management support is needed unless otherwise documented below in the visit note. 

## 2019-08-21 NOTE — Patient Instructions (Addendum)
Please schedule Medicare Wellness with Lawanna Kobus.   Start aspirin 81 mg  ER if severe or persistent chest tightness or chest pain   GO TO THE LAB : Get the blood work     GO TO THE FRONT DESK, PLEASE SCHEDULE YOUR APPOINTMENTS Come back for a checkup in 3 months   Get a chest x-ray: [facility]  [address]  [place], [place]  Walk ins welcome  Hours: 8:30am to 5pm
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
## 2019-08-22 ENCOUNTER — Encounter: Payer: Self-pay | Admitting: Internal Medicine

## 2019-08-22 LAB — CBC WITH DIFFERENTIAL/PLATELET
Basophils Absolute: 0 10*3/uL (ref 0.0–0.1)
Basophils Relative: 0.7 % (ref 0.0–3.0)
Eosinophils Absolute: 0.2 10*3/uL (ref 0.0–0.7)
Eosinophils Relative: 3.1 % (ref 0.0–5.0)
HCT: 43.6 % (ref 39.0–52.0)
Hemoglobin: 14.8 g/dL (ref 13.0–17.0)
Lymphocytes Relative: 23.8 % (ref 12.0–46.0)
Lymphs Abs: 1.5 10*3/uL (ref 0.7–4.0)
MCHC: 33.9 g/dL (ref 30.0–36.0)
MCV: 90 fl (ref 78.0–100.0)
Monocytes Absolute: 0.5 10*3/uL (ref 0.1–1.0)
Monocytes Relative: 8.7 % (ref 3.0–12.0)
Neutro Abs: 4 10*3/uL (ref 1.4–7.7)
Neutrophils Relative %: 63.7 % (ref 43.0–77.0)
Platelets: 164 10*3/uL (ref 150.0–400.0)
RBC: 4.85 Mil/uL (ref 4.22–5.81)
RDW: 14.6 % (ref 11.5–15.5)
WBC: 6.2 10*3/uL (ref 4.0–10.5)

## 2019-08-22 NOTE — Assessment & Plan Note (Signed)
Prediabetes: Check A1c, encourage healthy diet HTN: Currently on lisinopril, BP today is very good, refill meds, CMP. High cholesterol: On Lipitor, refills, check FLP BPH: To see urology in [place] next month. Chest pain: Single episode of chest pain associated with near passing out few weeks ago, since then having only chest pain, mostly at night and at rest. Multiple CV RF. EKG today: NSR, no acute changes, no old EKGs Plan: Chest x-ray Stress test.  ER if symptoms severe, start aspirin 81. Does not plan to be seen @ the  [place] again RTC for a CPX 3 months

## 2019-08-23 ENCOUNTER — Ambulatory Visit (HOSPITAL_COMMUNITY): Payer: Medicare Other | Attending: Cardiology

## 2019-08-23 ENCOUNTER — Other Ambulatory Visit: Payer: Self-pay

## 2019-08-23 VITALS — Ht 70.0 in | Wt 209.0 lb

## 2019-08-23 DIAGNOSIS — R079 Chest pain, unspecified: Secondary | ICD-10-CM | POA: Insufficient documentation

## 2019-08-23 LAB — MYOCARDIAL PERFUSION IMAGING
LV dias vol: 101 mL (ref 62–150)
LV sys vol: 36 mL
Peak HR: 82 {beats}/min
Rest HR: 61 {beats}/min
SDS: 0
SRS: 0
SSS: 0
TID: 1.05

## 2019-08-23 MED ORDER — TECHNETIUM TC 99M TETROFOSMIN IV KIT
10.2000 | PACK | Freq: Once | INTRAVENOUS | Status: AC | PRN
  Administered 2019-08-23: 10.2 via INTRAVENOUS
  Filled 2019-08-23: qty 11

## 2019-08-23 MED ORDER — REGADENOSON 0.4 MG/5ML IV SOLN
0.4000 mg | Freq: Once | INTRAVENOUS | Status: AC
  Administered 2019-08-23: 0.4 mg via INTRAVENOUS

## 2019-08-23 MED ORDER — TECHNETIUM TC 99M TETROFOSMIN IV KIT
32.8000 | PACK | Freq: Once | INTRAVENOUS | Status: AC | PRN
  Administered 2019-08-23: 32.8 via INTRAVENOUS
  Filled 2019-08-23: qty 33

## 2019-08-25 ENCOUNTER — Encounter: Payer: Self-pay | Admitting: Internal Medicine

## 2019-08-26 ENCOUNTER — Encounter: Payer: Self-pay | Admitting: Internal Medicine

## 2019-08-26 MED ORDER — ATORVASTATIN CALCIUM 40 MG PO TABS: 40.0000 mg | ORAL_TABLET | Freq: Every day | ORAL | 1 refills | Status: DC

## 2019-09-13 DIAGNOSIS — R972 Elevated prostate specific antigen [PSA]: Secondary | ICD-10-CM | POA: Diagnosis not present

## 2019-09-13 LAB — PSA: PSA: 4.25

## 2019-09-20 ENCOUNTER — Other Ambulatory Visit: Payer: Self-pay

## 2019-09-20 ENCOUNTER — Ambulatory Visit (INDEPENDENT_AMBULATORY_CARE_PROVIDER_SITE_OTHER)
Admission: RE | Admit: 2019-09-20 | Discharge: 2019-09-20 | Disposition: A | Discharge status: Home / Self Care | Payer: Medicare Other | Source: Ambulatory Visit | Attending: Internal Medicine | Admitting: Internal Medicine

## 2019-09-20 DIAGNOSIS — R079 Chest pain, unspecified: Secondary | ICD-10-CM | POA: Diagnosis not present

## 2019-09-20 DIAGNOSIS — R3915 Urgency of urination: Secondary | ICD-10-CM | POA: Diagnosis not present

## 2019-09-20 DIAGNOSIS — N403 Nodular prostate with lower urinary tract symptoms: Secondary | ICD-10-CM | POA: Diagnosis not present

## 2019-09-20 DIAGNOSIS — N5201 Erectile dysfunction due to arterial insufficiency: Secondary | ICD-10-CM | POA: Diagnosis not present

## 2019-09-20 DIAGNOSIS — R972 Elevated prostate specific antigen [PSA]: Secondary | ICD-10-CM | POA: Diagnosis not present

## 2019-09-23 ENCOUNTER — Encounter: Payer: Self-pay | Admitting: Internal Medicine

## 2019-09-27 ENCOUNTER — Ambulatory Visit (INDEPENDENT_AMBULATORY_CARE_PROVIDER_SITE_OTHER): Payer: Medicare Other | Admitting: Internal Medicine

## 2019-09-27 ENCOUNTER — Other Ambulatory Visit: Payer: Self-pay

## 2019-09-27 ENCOUNTER — Encounter: Payer: Self-pay | Admitting: Internal Medicine

## 2019-09-27 ENCOUNTER — Other Ambulatory Visit: Payer: Self-pay | Admitting: Internal Medicine

## 2019-09-27 VITALS — BP 116/69 | HR 81 | Temp 96.9°F | Resp 18 | Ht 70.0 in | Wt 208.5 lb

## 2019-09-27 DIAGNOSIS — E785 Hyperlipidemia, unspecified: Secondary | ICD-10-CM | POA: Diagnosis not present

## 2019-09-27 DIAGNOSIS — R079 Chest pain, unspecified: Secondary | ICD-10-CM

## 2019-09-27 DIAGNOSIS — R230 Cyanosis: Secondary | ICD-10-CM

## 2019-09-27 MED ORDER — ATORVASTATIN CALCIUM 80 MG PO TABS
80.0000 mg | ORAL_TABLET | Freq: Every day | ORAL | 1 refills | Status: DC
Start: 2019-09-27 — End: 2019-11-21

## 2019-09-27 NOTE — Patient Instructions (Addendum)
Please schedule Medicare Wellness with Lawanna Kobus.   COVID-19 Vaccine Information can be found at: [email or web address] For questions related to vaccine distribution or appointments, please email [email or web address] or call [phone].    We are referring you to the cardiologist in this building  Also we are ordering pulmonary function tests , done in [place] at the pulmonology office  Please call this number to schedule [phone]    GO TO THE FRONT DESK, PLEASE SCHEDULE YOUR APPOINTMENTS Come back for a checkup in 6 weeks

## 2019-09-27 NOTE — Progress Notes (Signed)
Pre visit review using our clinic review tool, if applicable. No additional management support is needed unless otherwise documented below in the visit note. 
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
## 2019-09-29 NOTE — Assessment & Plan Note (Signed)
Cyanosis ?: Reports blue lips on and off, O2 sat today is normal even after exertion.  Has an abnormal chest x-ray with a bullae, ATX and/or scarring  (unchanged for years). He used to be a light smoker, quit years ago, no chronic cough or wheezing. Plan: PFTs Chest pain: Recent work-up negative, continue with exertional symptoms, not taking aspirin consistently Plan: Aspirin daily, refer to cardiology, further evaluation?  (False negative stress test?)  Seek medical attention if symptoms severe or increase. High cholesterol: Not at goal on Lipitor 40 mg, has not change his diet as recommended, will increase to 80 mg daily and reassess in 6 weeks Encouraged to get the Covid vaccination RTC 6 weeks

## 2019-10-16 ENCOUNTER — Encounter: Payer: Self-pay | Admitting: Cardiology

## 2019-10-16 ENCOUNTER — Other Ambulatory Visit: Payer: Self-pay

## 2019-10-16 ENCOUNTER — Ambulatory Visit (INDEPENDENT_AMBULATORY_CARE_PROVIDER_SITE_OTHER): Payer: Medicare Other | Admitting: Cardiology

## 2019-10-16 VITALS — BP 118/72 | HR 71 | Ht 70.0 in | Wt 208.0 lb

## 2019-10-16 DIAGNOSIS — I10 Essential (primary) hypertension: Secondary | ICD-10-CM

## 2019-10-16 DIAGNOSIS — R0600 Dyspnea, unspecified: Secondary | ICD-10-CM

## 2019-10-16 DIAGNOSIS — R230 Cyanosis: Secondary | ICD-10-CM | POA: Insufficient documentation

## 2019-10-16 DIAGNOSIS — E782 Mixed hyperlipidemia: Secondary | ICD-10-CM | POA: Diagnosis not present

## 2019-10-16 DIAGNOSIS — R0789 Other chest pain: Secondary | ICD-10-CM

## 2019-10-16 DIAGNOSIS — R0609 Other forms of dyspnea: Secondary | ICD-10-CM | POA: Insufficient documentation

## 2019-10-16 HISTORY — DX: Other chest pain: R07.89

## 2019-10-16 HISTORY — DX: Cyanosis: R23.0

## 2019-10-16 HISTORY — DX: Dyspnea, unspecified: R06.00
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
## 2019-10-16 NOTE — Patient Instructions (Signed)
Medication Instructions:  Your physician recommends that you continue on your current medications as directed. Please refer to the Current Medication list given to you today.  *If you need a refill on your cardiac medications before your next appointment, please call your pharmacy*   Lab Work: None If you have labs (blood work) drawn today and your tests are completely normal, you will receive your results only by: . MyChart Message (if you have MyChart) OR . A paper copy in the mail If you have any lab test that is abnormal or we need to change your treatment, we will call you to review the results.   Testing/Procedures: Your physician has requested that you have an echocardiogram. Echocardiography is a painless test that uses sound waves to create images of your heart. It provides your doctor with information about the size and shape of your heart and how well your heart's chambers and valves are working. This procedure takes approximately one hour. There are no restrictions for this procedure.     Follow-Up: At [facility], you and your health needs are our priority.  As part of our continuing mission to provide you with exceptional heart care, we have created designated Provider Care Teams.  These Care Teams include your primary Cardiologist (physician) and Advanced Practice Providers (APPs -  Physician Assistants and Nurse Practitioners) who all work together to provide you with the care you need, when you need it.  We recommend signing up for the patient portal called "MyChart".  Sign up information is provided on this After Visit Summary.  MyChart is used to connect with patients for Virtual Visits (Telemedicine).  Patients are able to view lab/test results, encounter notes, upcoming appointments, etc.  Non-urgent messages can be sent to your provider as well.   To learn more about what you can do with MyChart, go to [email or web address].    Your next appointment:   1  month(s)  The format for your next appointment:   In Person  Provider:   Robert Krasowski, MD   Other Instructions   Echocardiogram An echocardiogram is a procedure that uses painless sound waves (ultrasound) to produce an image of the heart. Images from an echocardiogram can provide important information about:  Signs of coronary artery disease (CAD).  Aneurysm detection. An aneurysm is a weak or damaged part of an artery wall that bulges out from the normal force of blood pumping through the body.  Heart size and shape. Changes in the size or shape of the heart can be associated with certain conditions, including heart failure, aneurysm, and CAD.  Heart muscle function.  Heart valve function.  Signs of a past heart attack.  Fluid buildup around the heart.  Thickening of the heart muscle.  A tumor or infectious growth around the heart valves. Tell a health care provider about:  Any allergies you have.  All medicines you are taking, including vitamins, herbs, eye drops, creams, and over-the-counter medicines.  Any blood disorders you have.  Any surgeries you have had.  Any medical conditions you have.  Whether you are pregnant or may be pregnant. What are the risks? Generally, this is a safe procedure. However, problems may occur, including:  Allergic reaction to dye (contrast) that may be used during the procedure. What happens before the procedure? No specific preparation is needed. You may eat and drink normally. What happens during the procedure?   An IV tube may be inserted into one of your veins.  You may receive   contrast through this tube. A contrast is an injection that improves the quality of the pictures from your heart.  A gel will be applied to your chest.  A wand-like tool (transducer) will be moved over your chest. The gel will help to transmit the sound waves from the transducer.  The sound waves will harmlessly bounce off of your heart to  allow the heart images to be captured in real-time motion. The images will be recorded on a computer. The procedure may vary among health care providers and hospitals. What happens after the procedure?  You may return to your normal, everyday life, including diet, activities, and medicines, unless your health care provider tells you not to do that. Summary  An echocardiogram is a procedure that uses painless sound waves (ultrasound) to produce an image of the heart.  Images from an echocardiogram can provide important information about the size and shape of your heart, heart muscle function, heart valve function, and fluid buildup around your heart.  You do not need to do anything to prepare before this procedure. You may eat and drink normally.  After the echocardiogram is completed, you may return to your normal, everyday life, unless your health care provider tells you not to do that. This information is not intended to replace advice given to you by your health care provider. Make sure you discuss any questions you have with your health care provider. Document Revised: 08/09/2018 Document Reviewed: 05/21/2016 Elsevier Patient Education  2020 [employer]   
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
## 2019-10-28 ENCOUNTER — Encounter: Payer: Self-pay | Admitting: *Deleted

## 2019-10-28 ENCOUNTER — Ambulatory Visit (INDEPENDENT_AMBULATORY_CARE_PROVIDER_SITE_OTHER): Payer: Medicare Other | Admitting: *Deleted

## 2019-10-28 ENCOUNTER — Other Ambulatory Visit: Payer: Self-pay

## 2019-10-28 DIAGNOSIS — Z0000 Encounter for general adult medical examination without abnormal findings: Secondary | ICD-10-CM

## 2019-10-28 NOTE — Patient Instructions (Signed)
Mr. Birky , Thank you for taking time to come for your Medicare Wellness Visit. I appreciate your ongoing commitment to your health goals. Please review the following plan we discussed and let me know if I can assist you in the future.   Screening recommendations/referrals: Colorectal cancer screening: Completed 2018 at [facility] in [place] per pt.. Repeat every 5 yrs per pt. years Recommended yearly ophthalmology/optometry visit for glaucoma screening and checkup Recommended yearly dental visit for hygiene and checkup  Vaccinations: TDAP status: Up to date Pneumococcal vaccine status: Up to date Covid-19 vaccine status: Declined, Education has been provided regarding the importance of this vaccine but patient still declined. Advised may receive this vaccine at local pharmacy or Health Dept.or vaccine clinic. Aware to provide a copy of the vaccination record if obtained from local pharmacy or Health Dept. Verbalized acceptance and understanding.  Advanced directives: Bring a copy of your living will and/or healthcare power of attorney to your next office visit.    Next appointment: Follow up in one year for your annual wellness visit    Preventive Care 65 Years and Older, Male Preventive care refers to lifestyle choices and visits with your health care provider that can promote health and wellness. This includes:  A yearly physical exam. This is also called an annual well check.  Regular dental and eye exams.  Immunizations.  Screening for certain conditions.  Healthy lifestyle choices, such as diet and exercise. What can I expect for my preventive care visit? Physical exam Your health care provider will check:  Height and weight. These may be used to calculate body mass index (BMI), which is a measurement that tells if you are at a healthy weight.  Heart rate and blood pressure.  Your skin for abnormal spots. Counseling Your health care provider may ask you questions  about:  Alcohol, tobacco, and drug use.  Emotional well-being.  Home and relationship well-being.  Sexual activity.  Eating habits.  History of falls.  Memory and ability to understand (cognition).  Work and work Statistician. What immunizations do I need?  Influenza (flu) vaccine  This is recommended every year. Tetanus, diphtheria, and pertussis (Tdap) vaccine  You may need a Td booster every 10 years. Varicella (chickenpox) vaccine  You may need this vaccine if you have not already been vaccinated. Zoster (shingles) vaccine  You may need this after age 74. Pneumococcal conjugate (PCV13) vaccine  One dose is recommended after age 71. Pneumococcal polysaccharide (PPSV23) vaccine  One dose is recommended after age 47. Measles, mumps, and rubella (MMR) vaccine  You may need at least one dose of MMR if you were born in 1957 or later. You may also need a second dose. Meningococcal conjugate (MenACWY) vaccine  You may need this if you have certain conditions. Hepatitis A vaccine  You may need this if you have certain conditions or if you travel or work in places where you may be exposed to hepatitis A. Hepatitis B vaccine  You may need this if you have certain conditions or if you travel or work in places where you may be exposed to hepatitis B. Haemophilus influenzae type b (Hib) vaccine  You may need this if you have certain conditions. You may receive vaccines as individual doses or as more than one vaccine together in one shot (combination vaccines). Talk with your health care provider about the risks and benefits of combination vaccines. What tests do I need? Blood tests  Lipid and cholesterol levels. These may be  checked every 5 years, or more frequently depending on your overall health.  Hepatitis C test.  Hepatitis B test. Screening  Lung cancer screening. You may have this screening every year starting at age 61 if you have a 30-pack-year history of  smoking and currently smoke or have quit within the past 15 years.  Colorectal cancer screening. All adults should have this screening starting at age 82 and continuing until age 64. Your health care provider may recommend screening at age 103 if you are at increased risk. You will have tests every 1-10 years, depending on your results and the type of screening test.  Prostate cancer screening. Recommendations will vary depending on your family history and other risks.  Diabetes screening. This is done by checking your blood sugar (glucose) after you have not eaten for a while (fasting). You may have this done every 1-3 years.  Abdominal aortic aneurysm (AAA) screening. You may need this if you are a current or former smoker.  Sexually transmitted disease (STD) testing. Follow these instructions at home: Eating and drinking  Eat a diet that includes fresh fruits and vegetables, whole grains, lean protein, and low-fat dairy products. Limit your intake of foods with high amounts of sugar, saturated fats, and salt.  Take vitamin and mineral supplements as recommended by your health care provider.  Do not drink alcohol if your health care provider tells you not to drink.  If you drink alcohol: ? Limit how much you have to 0-2 drinks a day. ? Be aware of how much alcohol is in your drink. In the [place] one drink equals one 12 oz bottle of beer (355 mL), one 5 oz glass of wine (148 mL), or one 1 oz glass of hard liquor (44 mL). Lifestyle  Take daily care of your teeth and gums.  Stay active. Exercise for at least 30 minutes on 5 or more days each week.  Do not use any products that contain nicotine or tobacco, such as cigarettes, e-cigarettes, and chewing tobacco. If you need help quitting, ask your health care provider.  If you are sexually active, practice safe sex. Use a condom or other form of protection to prevent STIs (sexually transmitted infections).  Talk with your health care  provider about taking a low-dose aspirin or statin. What's next?  Visit your health care provider once a year for a well check visit.  Ask your health care provider how often you should have your eyes and teeth checked.  Stay up to date on all vaccines. This information is not intended to replace advice given to you by your health care provider. Make sure you discuss any questions you have with your health care provider. Document Revised: 04/12/2018 Document Reviewed: 04/12/2018 Elsevier Patient Education  2020 [employer].

## 2019-11-05 ENCOUNTER — Ambulatory Visit: Payer: Medicare Other | Admitting: Internal Medicine

## 2019-11-12 ENCOUNTER — Other Ambulatory Visit (HOSPITAL_COMMUNITY): Payer: Medicare Other

## 2019-11-16 ENCOUNTER — Other Ambulatory Visit (HOSPITAL_COMMUNITY)
Admission: RE | Admit: 2019-11-16 | Discharge: 2019-11-16 | Disposition: A | Discharge status: Home / Self Care | Payer: Medicare Other | Source: Ambulatory Visit | Attending: Internal Medicine | Admitting: Internal Medicine

## 2019-11-16 DIAGNOSIS — Z01812 Encounter for preprocedural laboratory examination: Secondary | ICD-10-CM | POA: Insufficient documentation

## 2019-11-16 DIAGNOSIS — Z20822 Contact with and (suspected) exposure to covid-19: Secondary | ICD-10-CM | POA: Diagnosis not present

## 2019-11-16 LAB — SARS CORONAVIRUS 2 (TAT 6-24 HRS): SARS Coronavirus 2: NEGATIVE

## 2019-11-18 DIAGNOSIS — M79672 Pain in left foot: Secondary | ICD-10-CM | POA: Diagnosis not present

## 2019-11-18 DIAGNOSIS — B351 Tinea unguium: Secondary | ICD-10-CM | POA: Diagnosis not present

## 2019-11-18 DIAGNOSIS — M79671 Pain in right foot: Secondary | ICD-10-CM | POA: Diagnosis not present

## 2019-11-19 ENCOUNTER — Ambulatory Visit (INDEPENDENT_AMBULATORY_CARE_PROVIDER_SITE_OTHER): Payer: Medicare Other | Admitting: Internal Medicine

## 2019-11-19 ENCOUNTER — Other Ambulatory Visit: Payer: Self-pay

## 2019-11-19 DIAGNOSIS — R230 Cyanosis: Secondary | ICD-10-CM | POA: Diagnosis not present

## 2019-11-19 LAB — PULMONARY FUNCTION TEST
DL/VA % pred: 147 %
DL/VA: 6.02 ml/min/mmHg/L
DLCO cor % pred: 113 %
DLCO cor: 31.05 ml/min/mmHg
DLCO unc % pred: 113 %
DLCO unc: 31.05 ml/min/mmHg
FEF 25-75 Post: 4.14 L/sec
FEF 25-75 Pre: 3.5 L/sec
FEF2575-%Change-Post: 18 %
FEF2575-%Pred-Post: 152 %
FEF2575-%Pred-Pre: 128 %
FEV1-%Change-Post: 2 %
FEV1-%Pred-Post: 88 %
FEV1-%Pred-Pre: 86 %
FEV1-Post: 3.1 L
FEV1-Pre: 3.03 L
FEV1FVC-%Change-Post: 2 %
FEV1FVC-%Pred-Pre: 112 %
FEV6-%Change-Post: 0 %
FEV6-%Pred-Post: 81 %
FEV6-%Pred-Pre: 81 %
FEV6-Post: 3.63 L
FEV6-Pre: 3.63 L
FEV6FVC-%Pred-Post: 105 %
FEV6FVC-%Pred-Pre: 105 %
FVC-%Change-Post: 0 %
FVC-%Pred-Post: 77 %
FVC-%Pred-Pre: 77 %
FVC-Post: 3.63 L
FVC-Pre: 3.63 L
Post FEV1/FVC ratio: 85 %
Post FEV6/FVC ratio: 100 %
Pre FEV1/FVC ratio: 84 %
Pre FEV6/FVC Ratio: 100 %
RV % pred: 115 %
RV: 2.82 L
TLC % pred: 87 %
TLC: 6.35 L

## 2019-11-19 NOTE — Progress Notes (Signed)
Full PFT performed today. °

## 2019-11-20 ENCOUNTER — Encounter: Payer: Self-pay | Admitting: Internal Medicine

## 2019-11-20 ENCOUNTER — Ambulatory Visit (INDEPENDENT_AMBULATORY_CARE_PROVIDER_SITE_OTHER): Payer: Medicare Other | Admitting: Internal Medicine

## 2019-11-20 VITALS — BP 114/72 | HR 66 | Temp 98.1°F | Resp 18 | Ht 70.0 in | Wt 209.4 lb

## 2019-11-20 DIAGNOSIS — E782 Mixed hyperlipidemia: Secondary | ICD-10-CM

## 2019-11-20 DIAGNOSIS — R230 Cyanosis: Secondary | ICD-10-CM

## 2019-11-20 DIAGNOSIS — R079 Chest pain, unspecified: Secondary | ICD-10-CM

## 2019-11-20 LAB — LIPID PANEL
Cholesterol: 179 mg/dL (ref 0–200)
HDL: 29.2 mg/dL — ABNORMAL LOW (ref >39.00)
NonHDL: 149.76
Total CHOL/HDL Ratio: 6
Triglycerides: 355 mg/dL — ABNORMAL HIGH (ref 0.0–149.0)
VLDL: 71 mg/dL — ABNORMAL HIGH (ref 0.0–40.0)

## 2019-11-20 LAB — LDL CHOLESTEROL, DIRECT: Direct LDL: 86 mg/dL
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
## 2019-11-20 NOTE — Patient Instructions (Signed)
Check the  blood pressure  BP GOAL is between 110/65 and  135/85. If it is consistently higher or lower, let me know    GO TO THE LAB : Get the blood work     GO TO THE FRONT DESK, PLEASE SCHEDULE YOUR APPOINTMENTS Come back for a checkup 4 months

## 2019-11-20 NOTE — Progress Notes (Signed)
Pre visit review using our clinic review tool, if applicable. No additional management support is needed unless otherwise documented below in the visit note. 

## 2019-11-20 NOTE — Assessment & Plan Note (Addendum)
Cyanosis?   PFTs reviewed: FVC 77% FEV1/FVC 112 TLC normal DLCO normal Increase FEV1/FVC with no obstruction raises a question of vascular lung disease, P-HTN Patient states cyanosis is noted by the girlfriend, he is not aware. Plan: Echo is pending ordered by cardiology, if there is evidence of P-HTN further evaluation will be needed otherwise recommend observation C P: Saw cardiology 10/16/2019, chest pain felt to be atypical, probably MSK, recommended lidocaine patch which he is using with some help. Echo was ordered due to question of cyanosis and DOE. Cough: For few days, URI?  Recommend to call if not gradually better. High cholesterol: Based on last FLP, Lipitor increase to 80 mg, checking labs. RTC 4 months

## 2019-11-21 MED ORDER — ATORVASTATIN CALCIUM 80 MG PO TABS: 80.0000 mg | ORAL_TABLET | Freq: Every day | ORAL | 3 refills | Status: DC

## 2019-11-21 NOTE — Addendum Note (Signed)
Addended byConrad Camargo D on: 11/21/2019 02:19 PM   Modules accepted: Orders

## 2019-11-22 ENCOUNTER — Ambulatory Visit (HOSPITAL_BASED_OUTPATIENT_CLINIC_OR_DEPARTMENT_OTHER)
Admission: RE | Admit: 2019-11-22 | Discharge: 2019-11-22 | Disposition: A | Discharge status: Home / Self Care | Payer: Medicare Other | Source: Ambulatory Visit | Attending: Cardiology | Admitting: Cardiology

## 2019-11-22 ENCOUNTER — Other Ambulatory Visit: Payer: Self-pay

## 2019-11-22 DIAGNOSIS — R0609 Other forms of dyspnea: Secondary | ICD-10-CM

## 2019-11-22 DIAGNOSIS — R0789 Other chest pain: Secondary | ICD-10-CM | POA: Diagnosis not present

## 2019-11-22 DIAGNOSIS — R0600 Dyspnea, unspecified: Secondary | ICD-10-CM | POA: Diagnosis not present

## 2019-11-22 LAB — ECHOCARDIOGRAM COMPLETE
Area-P 1/2: 2.22 cm2
S' Lateral: 2.19 cm

## 2019-12-06 ENCOUNTER — Encounter: Payer: Self-pay | Admitting: Cardiology

## 2019-12-06 ENCOUNTER — Other Ambulatory Visit: Payer: Self-pay

## 2019-12-06 ENCOUNTER — Ambulatory Visit (INDEPENDENT_AMBULATORY_CARE_PROVIDER_SITE_OTHER): Payer: Medicare Other | Admitting: Cardiology

## 2019-12-06 VITALS — BP 126/72 | HR 73 | Ht 70.0 in | Wt 209.0 lb

## 2019-12-06 DIAGNOSIS — R0600 Dyspnea, unspecified: Secondary | ICD-10-CM | POA: Diagnosis not present

## 2019-12-06 DIAGNOSIS — E782 Mixed hyperlipidemia: Secondary | ICD-10-CM | POA: Diagnosis not present

## 2019-12-06 DIAGNOSIS — I10 Essential (primary) hypertension: Secondary | ICD-10-CM | POA: Diagnosis not present

## 2019-12-06 DIAGNOSIS — R0789 Other chest pain: Secondary | ICD-10-CM | POA: Diagnosis not present

## 2019-12-06 DIAGNOSIS — R0609 Other forms of dyspnea: Secondary | ICD-10-CM
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
## 2019-12-06 NOTE — Patient Instructions (Signed)
Medication Instructions:  Your physician recommends that you continue on your current medications as directed. Please refer to the Current Medication list given to you today.  *If you need a refill on your cardiac medications before your next appointment, please call your pharmacy*   Lab Work: None ordered  If you have labs (blood work) drawn today and your tests are completely normal, you will receive your results only by: . MyChart Message (if you have MyChart) OR . A paper copy in the mail If you have any lab test that is abnormal or we need to change your treatment, we will call you to review the results.   Testing/Procedures: None ordered   Follow-Up: At [facility], you and your health needs are our priority.  As part of our continuing mission to provide you with exceptional heart care, we have created designated Provider Care Teams.  These Care Teams include your primary Cardiologist (physician) and Advanced Practice Providers (APPs -  Physician Assistants and Nurse Practitioners) who all work together to provide you with the care you need, when you need it.  We recommend signing up for the patient portal called "MyChart".  Sign up information is provided on this After Visit Summary.  MyChart is used to connect with patients for Virtual Visits (Telemedicine).  Patients are able to view lab/test results, encounter notes, upcoming appointments, etc.  Non-urgent messages can be sent to your provider as well.   To learn more about what you can do with MyChart, go to [email or web address].    Your next appointment:   5 month(s)  The format for your next appointment:   In Person  Provider:   Robert Krasowski, MD   Other Instructions   

## 2020-02-21 DIAGNOSIS — B351 Tinea unguium: Secondary | ICD-10-CM | POA: Diagnosis not present

## 2020-02-21 DIAGNOSIS — M79671 Pain in right foot: Secondary | ICD-10-CM | POA: Diagnosis not present

## 2020-02-21 DIAGNOSIS — M79672 Pain in left foot: Secondary | ICD-10-CM | POA: Diagnosis not present

## 2020-03-24 ENCOUNTER — Ambulatory Visit: Payer: Medicare Other | Admitting: Internal Medicine

## 2020-04-09 ENCOUNTER — Other Ambulatory Visit: Payer: Self-pay

## 2020-04-09 ENCOUNTER — Ambulatory Visit (INDEPENDENT_AMBULATORY_CARE_PROVIDER_SITE_OTHER): Payer: Medicare Other | Admitting: Internal Medicine

## 2020-04-09 ENCOUNTER — Encounter: Payer: Self-pay | Admitting: Internal Medicine

## 2020-04-09 VITALS — BP 147/71 | HR 75 | Temp 98.0°F | Ht 70.0 in | Wt 210.0 lb

## 2020-04-09 DIAGNOSIS — R079 Chest pain, unspecified: Secondary | ICD-10-CM | POA: Diagnosis not present

## 2020-04-09 DIAGNOSIS — R972 Elevated prostate specific antigen [PSA]: Secondary | ICD-10-CM | POA: Diagnosis not present

## 2020-04-09 DIAGNOSIS — M5416 Radiculopathy, lumbar region: Secondary | ICD-10-CM

## 2020-04-09 DIAGNOSIS — I10 Essential (primary) hypertension: Secondary | ICD-10-CM | POA: Diagnosis not present

## 2020-04-09 DIAGNOSIS — R739 Hyperglycemia, unspecified: Secondary | ICD-10-CM | POA: Diagnosis not present

## 2020-04-09 LAB — COMPREHENSIVE METABOLIC PANEL
ALT: 32 U/L (ref 0–53)
AST: 24 U/L (ref 0–37)
Albumin: 4.2 g/dL (ref 3.5–5.2)
Alkaline Phosphatase: 71 U/L (ref 39–117)
BUN: 15 mg/dL (ref 6–23)
CO2: 31 mEq/L (ref 19–32)
Calcium: 9.2 mg/dL (ref 8.4–10.5)
Chloride: 103 mEq/L (ref 96–112)
Creatinine, Ser: 0.92 mg/dL (ref 0.40–1.50)
GFR: 86.1 mL/min (ref >60.00)
Glucose, Bld: 164 mg/dL — ABNORMAL HIGH (ref 70–99)
Potassium: 3.8 mEq/L (ref 3.5–5.1)
Sodium: 139 mEq/L (ref 135–145)
Total Bilirubin: 0.5 mg/dL (ref 0.2–1.2)
Total Protein: 7.2 g/dL (ref 6.0–8.3)

## 2020-04-09 LAB — HEMOGLOBIN A1C: Hgb A1c MFr Bld: 6.3 % (ref 4.6–6.5)

## 2020-04-09 NOTE — Assessment & Plan Note (Signed)
Prediabetes: Diet and exercise discussed, check A1c HTN: BP today for the first time minimally elevated, continue lisinopril, recommend ambulatory BPs, check CMP Radiculopathy?:  Occasional pain at the lateral aspect of the left leg, Rx options include PT, see a specialist, no intervention.  He will let me know what he likes to do.  For now, recommend Tylenol. DOE/chest pain: Saw cardiology, echo and stress test essentially benign, Rx CV RF modification and start exercising. Cyanosis?  See previous visits, today patient reports is much less noticeable, echo WNL.  No further evaluation needed at this time BPH, elevated PSA: Seen by urology 09/20/2019, issue felt to be chronic/fluctuant, ok to reassume yearly checks by PCP.  Last PSA May 2021 was 4.25. Preventive care: Immunization at the [place], hesitant to proceed with COVID vaccinations and influenza shot. d/w pt pro>>cons  RTC 6 months

## 2020-04-09 NOTE — Patient Instructions (Addendum)
Check the  blood pressure 2 or 3 times a month  BP GOAL is between 110/65 and  135/85. If it is consistently higher or lower, let me know   Let me know if the left leg pain gets worse, let me know if you need a referral. Okay to take Tylenol for pain. Try to avoid anti-inflammatories such as ibuprofen or naproxen  GO TO THE LAB : Get the blood work     GO TO THE FRONT DESK, PLEASE SCHEDULE YOUR APPOINTMENTS Come back for a checkup in 6 months
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
## 2020-04-28 LAB — HM DIABETES EYE EXAM

## 2020-05-12 ENCOUNTER — Encounter: Payer: Self-pay | Admitting: Internal Medicine

## 2020-05-12 DIAGNOSIS — R0600 Dyspnea, unspecified: Secondary | ICD-10-CM

## 2020-05-12 DIAGNOSIS — R0609 Other forms of dyspnea: Secondary | ICD-10-CM

## 2020-05-26 DIAGNOSIS — N5201 Erectile dysfunction due to arterial insufficiency: Secondary | ICD-10-CM | POA: Diagnosis not present

## 2020-05-26 DIAGNOSIS — R3912 Poor urinary stream: Secondary | ICD-10-CM | POA: Diagnosis not present

## 2020-05-26 DIAGNOSIS — R972 Elevated prostate specific antigen [PSA]: Secondary | ICD-10-CM | POA: Diagnosis not present

## 2020-05-28 ENCOUNTER — Telehealth: Payer: Self-pay | Admitting: Cardiology

## 2020-05-28 NOTE — Telephone Encounter (Signed)
   [facility] Pre-operative Risk Assessment    HEARTCARE STAFF: - Please ensure there is not already an duplicate clearance open for this procedure. - Under Visit Info/Reason for Call, type in Other and utilize the format Clearance MM/DD/YY or Clearance TBD. Do not use dashes or single digits. - If request is for dental extraction, please clarify the # of teeth to be extracted.  Request for surgical clearance:  1. What type of surgery is being performed? prostate   2. When is this surgery scheduled? Not yet  3. What type of clearance is required (medical clearance vs. Pharmacy clearance to hold med vs. Both)? Medical   4. Are there any medications that need to be held prior to surgery and how long? medical  Practice name and name of physician performing surgery?  [facility]  5. What is the office phone number? [phone] ext [phone]   7.   What is the office fax number? [phone]  8.   Anesthesia type (None, local, MAC, general) ? General    Shon Millet 05/28/2020, 1:01 PM  _________________________________________________________________   (provider comments below)

## 2020-05-28 NOTE — Telephone Encounter (Signed)
Left message to call back 05/28/2020 at 1318.  Patient needs call back.

## 2020-05-29 NOTE — Telephone Encounter (Signed)
Left message for the patient to call back and speak to the on-call preop APP of the day 

## 2020-06-05 NOTE — Telephone Encounter (Signed)
Please schedule appointment with Dr. Bing Matter within 2 weeks. Patient is having continued chest pains and SOB with activity

## 2020-06-05 NOTE — Telephone Encounter (Signed)
   Primary Cardiologist: Jenne Campus, MD  Chart reviewed as part of pre-operative protocol coverage. Patient was contacted 06/05/2020 in reference to pre-operative risk assessment for pending surgery as outlined below.  Shane Fowler was last seen on 12/06/19 by Dr. Agustin Cree.  Since that day, Shane Fowler has continued to have chest pain and shortness of breath with activity. He is unable to complete 4 MET's without symptoms.   Due to ongoing chest pain and DOE, [name] will require a follow-up visit for further pre-operative risk assessment.  Pre-op covering staff: - Please schedule appointment with Dr. Agustin Cree within 2 weeks and call patient to inform them. Please add "pre-op clearance" to the appointment notes so provider is aware. - Please contact requesting surgeon's office via preferred method (i.e, phone, fax) to inform them of need for appointment prior to surgery.  Abigail Butts, PA-C 06/05/2020, 12:22 PM

## 2020-06-08 NOTE — Telephone Encounter (Signed)
Left message for patient to return call.

## 2020-06-09 NOTE — Telephone Encounter (Signed)
Sent message to schedulers to get him scheduled.

## 2020-06-12 ENCOUNTER — Other Ambulatory Visit: Payer: Self-pay | Admitting: Urology

## 2020-06-12 ENCOUNTER — Ambulatory Visit (INDEPENDENT_AMBULATORY_CARE_PROVIDER_SITE_OTHER): Payer: Medicare Other | Admitting: Pulmonary Disease

## 2020-06-12 ENCOUNTER — Encounter: Payer: Self-pay | Admitting: Pulmonary Disease

## 2020-06-12 ENCOUNTER — Other Ambulatory Visit: Payer: Self-pay

## 2020-06-12 VITALS — BP 124/74 | HR 67 | Temp 97.9°F | Ht 70.0 in | Wt 210.0 lb

## 2020-06-12 DIAGNOSIS — M940 Chondrocostal junction syndrome [Tietze]: Secondary | ICD-10-CM | POA: Diagnosis not present

## 2020-06-12 DIAGNOSIS — J9811 Atelectasis: Secondary | ICD-10-CM

## 2020-06-12 MED ORDER — DICLOFENAC SODIUM 1 % EX GEL: CUTANEOUS | 1 refills | Status: AC

## 2020-06-12 NOTE — Progress Notes (Signed)
@Patient  ID: Shane Fowler, male    DOB: [birth year], 70 y.o.   MRN: [id number]  Chief Complaint  Patient presents with  . Consult    Referred by PCP for DOE and right sided chest pain. States the pain is constant. He has been cleared by cardiology for the chest pain.     Referring provider: Colon Branch, MD  HPI:    70 year old man whom we are seeing in consultation at the request of Belinda Fisher, MD for evaluation of right-sided chest pain.  PCP notes reviewed.  Cardiology notes reviewed.  Chest pain present for some months.  Worse with exertion when going up stairs, pushing a wheelchair for his friend.  Often relieved with rest.  Can occur at rest as well.  Seems to linger for many minutes, gradually wears off but the sensation is nearly present at all times.  No real shortness of breath or dyspnea exertion.  Heavy breathing does make him noticed that sensation a bit more.  He feels like it limits his ability to take a deep breath at times.  He denies any syncope or presyncope.  No other exacerbating factors.  He identifies no alleviating factors.  Not take any medicines.  Work-up today included PFTs which reviewed as below and relatively normal.  Chest x-ray that on my interpretation looks good with mild atelectasis right mid base, no infiltrate.  Echocardiogram that was normal and reassuring.  Stress test that was normal and reassuring.  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  MMRC: mMRC Dyspnea Scale mMRC Score  06/12/2020 1    Epworth:  No flowsheet data found.  Tests:   FENO:  No results found for: NITRICOXIDE  PFT: PFT Results Latest Ref Rng & Units 09/27/2019  FVC-Pre L  3.63  FVC-Predicted Pre % 77  FVC-Post L 3.63  FVC-Predicted Post % 77  Pre FEV1/FVC % % 84  Post FEV1/FCV % % 85  FEV1-Pre L 3.03  FEV1-Predicted Pre % 86  FEV1-Post L 3.10  DLCO uncorrected ml/min/mmHg 31.05  DLCO UNC% % 113  DLCO corrected ml/min/mmHg 31.05  DLCO COR %Predicted % 113  DLVA Predicted % 147  TLC L 6.35  TLC % Predicted % 87  RV % Predicted % 115  Personally reviewed and interpreted as normal spirometry, no bronchodilator response, lung volumes okay, DLCO within normal limits  WALK:  No flowsheet data found.  Imaging: Personally reviewed and as per EMR discussion in this note  Lab Results: Personally reviewed CBC    Component Value Date/Time   WBC 6.2 08/21/2019 0910   RBC 4.85 08/21/2019 0910   HGB 14.8 08/21/2019 0910   HCT 43.6 08/21/2019 0910   PLT 164.0 08/21/2019 0910   MCV 90.0 08/21/2019 0910   MCV 95.5 04/27/2013 1216   MCH 30.8 04/27/2013 1216   MCH 32.1 12/24/2009 0335   MCHC 33.9 08/21/2019 0910   RDW 14.6 08/21/2019 0910   LYMPHSABS 1.5 08/21/2019 0910   MONOABS 0.5 08/21/2019 0910   EOSABS 0.2 08/21/2019 0910   BASOSABS 0.0 08/21/2019 0910    BMET    Component Value Date/Time   NA 139 04/09/2020 0855   NA 139 01/23/2018 0000   K  3.8 04/09/2020 0855   CL 103 04/09/2020 0855   CO2 31 04/09/2020 0855   GLUCOSE 164 (H) 04/09/2020 0855   BUN 15 04/09/2020 0855   BUN 17 01/23/2018 0000   CREATININE 0.92 04/09/2020 0855   CALCIUM 9.2 04/09/2020 0855   GFRNONAA >60 12/24/2009 0335   GFRAA  12/24/2009 0335    >60        The eGFR has been calculated using the MDRD equation. This calculation has not been validated in all clinical situations. eGFR's persistently <60 mL/min signify possible Chronic Kidney Disease.    BNP No results found for: BNP  ProBNP No results found for: PROBNP  Specialty Problems      Pulmonary Problems   Dyspnea on exertion      Allergies  Allergen Reactions  . Nitroglycerin Other (See  Comments)    Immunization History  Administered Date(s) Administered  . Influenza-Unspecified 01/22/2018  . Pneumococcal Polysaccharide-23 01/24/2017  . Tdap 10/30/2013    Past Medical History:  Diagnosis Date  . Annual physical exam 10/19/2018  . BPH (benign prostatic hyperplasia) 10/21/2018  . Elevated PSA   . Glaucoma suspect   . Headache 09/21/2018  . Hyperlipidemia   . Hypertension 09/21/2018  . Tinnitus   . Vitamin D deficiency     Tobacco History: Social History   Tobacco Use  Smoking Status Former Smoker  . Packs/day: 0.25  . Start date: 1964  . Quit date: 55  . Years since quitting: 48.1  Smokeless Tobacco Never Used   Counseling given: Not Answered   Continue to not smoke  Outpatient Encounter Medications as of 06/12/2020  Medication Sig  . aspirin EC 81 MG tablet Take 81 mg by mouth daily.  Marland Kitchen atorvastatin (LIPITOR) 80 MG tablet Take 1 tablet (80 mg total) by mouth at bedtime.  . carboxymethylcellulose (REFRESH PLUS) 0.5 % SOLN 1 drop 3 (three) times daily as needed.  . cholecalciferol (VITAMIN D3) 25 MCG (1000 UT) tablet Take 1,000 Units by mouth daily.  . diclofenac Sodium (VOLTAREN) 1 % GEL Apply small amount along right chest near sternum where painful up to three times a day as needed  . lisinopril (ZESTRIL) 10 MG tablet Take 15 mg by mouth daily.  Marland Kitchen omega-3 acid ethyl esters (LOVAZA) 1 g capsule Take 1 g by mouth daily.  Marland Kitchen Propylene Glycol (SYSTANE BALANCE) 0.6 % SOLN PRN   No facility-administered encounter medications on file as of 06/12/2020.     Review of Systems  Review of Systems  No orthopnea or PND.  No lower extremity swelling.  Conference review of systems otherwise negative. Physical Exam  BP 124/74   Pulse 67   Temp 97.9 F (36.6 C) (Temporal)   Ht 5' 10"  (1.778 m)   Wt 210 lb (95.3 kg)   SpO2 97% Comment: on RA  BMI 30.13 kg/m   Wt Readings from Last 5 Encounters:  06/12/20 210 lb (95.3 kg)  04/09/20 210 lb (95.3 kg)   12/06/19 209 lb (94.8 kg)  11/20/19 209 lb 6 oz (95 kg)  10/16/19 208 lb (94.3 kg)    BMI Readings from Last 5 Encounters:  06/12/20 30.13 kg/m  04/09/20 30.13 kg/m  12/06/19 29.99 kg/m  11/20/19 30.04 kg/m  10/16/19 29.84 kg/m     Physical Exam General: Well-appearing, no acute distress Eyes: EOMI, no icterus Neck: Supple, no JVD appreciated Cardiovascular: Rate rhythm, no murmur Pulmonary: Distant, no wheeze, clear to auscultation Abdomen: Nondistended, bowel sounds present MSK: No  joint effusions, midsternum costochondral junction tender to palpation, relatively severe, less severe in costochondral junction above and below that site, mild radiation laterally, no tenderness on the left, Neuro: Normal gait, no weakness Psych: Normal mood, full affect   Assessment & Plan:   Chest pain: Chronic costochondritis.  Clearly reproducible severe pain along the right costochondral area with mild radiation laterally.  None on the left.  Recent cardiac work-up including echocardiogram and stress test within normal limits.  Recent chest x-ray reassuring, mild atelectasis right base likely related to hypoventilation/not deep breaths in setting of chest pain.  Bulla on the right mid chest present since at least 2005 on CT scan.  Recommend Voltaren gel symptomatically up to 3 times a day as needed.  Further treatment or work-up at the discretion of his PCP.  Atelectasis: Again, sleep suspect related to hypoventilation the setting of his right-sided chest pain.  Will evaluate  CT chest without contrast to rule out scar, more nefarious process.  He denies any history of pneumonia that may have led to scar.   Return if symptoms worsen or fail to improve.   Lanier Clam, MD 06/12/2020

## 2020-06-12 NOTE — Patient Instructions (Addendum)
I think the chest pain is due to costochondritis - use Voltaren gel in area of pain up to 3 times a day as needed   If the pain persists please contact Dr. Drue Novel for further direction  I ordered a CT to evaluate the sticky lung or atelectasis on the chest xray - I suspect this is due to not taking deep breaths with the pain  The bulla on the chest xray has been present since at least 2005 it is not a tumor or anything.  The chest pain is not related to anything seen on the chest xray, in my opinion.

## 2020-06-15 ENCOUNTER — Ambulatory Visit (HOSPITAL_BASED_OUTPATIENT_CLINIC_OR_DEPARTMENT_OTHER)
Admission: RE | Admit: 2020-06-15 | Discharge: 2020-06-15 | Disposition: A | Discharge status: Home / Self Care | Payer: Medicare Other | Source: Ambulatory Visit | Attending: Pulmonary Disease | Admitting: Pulmonary Disease

## 2020-06-15 ENCOUNTER — Other Ambulatory Visit: Payer: Self-pay

## 2020-06-15 DIAGNOSIS — I700 Atherosclerosis of aorta: Secondary | ICD-10-CM | POA: Diagnosis not present

## 2020-06-15 DIAGNOSIS — J9811 Atelectasis: Secondary | ICD-10-CM | POA: Diagnosis not present

## 2020-06-15 DIAGNOSIS — J439 Emphysema, unspecified: Secondary | ICD-10-CM | POA: Diagnosis not present

## 2020-06-15 DIAGNOSIS — I2510 Atherosclerotic heart disease of native coronary artery without angina pectoris: Secondary | ICD-10-CM | POA: Diagnosis not present

## 2020-06-15 DIAGNOSIS — M47814 Spondylosis without myelopathy or radiculopathy, thoracic region: Secondary | ICD-10-CM | POA: Diagnosis not present

## 2020-06-16 NOTE — Telephone Encounter (Signed)
MH please advise on the results of the CT.

## 2020-06-18 NOTE — Telephone Encounter (Signed)
Patient sent email.  So do any of the findings have anything to do with the pain or do I keep using the gel.  Dr. Judeth Horn please advise, also if the CT order needs to be placed

## 2020-06-24 DIAGNOSIS — M79672 Pain in left foot: Secondary | ICD-10-CM | POA: Diagnosis not present

## 2020-06-24 DIAGNOSIS — B351 Tinea unguium: Secondary | ICD-10-CM | POA: Diagnosis not present

## 2020-06-24 DIAGNOSIS — M79671 Pain in right foot: Secondary | ICD-10-CM | POA: Diagnosis not present

## 2020-07-10 DIAGNOSIS — R972 Elevated prostate specific antigen [PSA]: Secondary | ICD-10-CM | POA: Insufficient documentation

## 2020-07-10 DIAGNOSIS — E119 Type 2 diabetes mellitus without complications: Secondary | ICD-10-CM

## 2020-07-10 DIAGNOSIS — H5210 Myopia, unspecified eye: Secondary | ICD-10-CM | POA: Insufficient documentation

## 2020-07-10 DIAGNOSIS — H40013 Open angle with borderline findings, low risk, bilateral: Secondary | ICD-10-CM | POA: Insufficient documentation

## 2020-07-10 DIAGNOSIS — H9319 Tinnitus, unspecified ear: Secondary | ICD-10-CM | POA: Insufficient documentation

## 2020-07-10 DIAGNOSIS — E559 Vitamin D deficiency, unspecified: Secondary | ICD-10-CM | POA: Insufficient documentation

## 2020-07-10 DIAGNOSIS — B351 Tinea unguium: Secondary | ICD-10-CM

## 2020-07-10 DIAGNOSIS — H40009 Preglaucoma, unspecified, unspecified eye: Secondary | ICD-10-CM | POA: Insufficient documentation

## 2020-07-10 DIAGNOSIS — R809 Proteinuria, unspecified: Secondary | ICD-10-CM

## 2020-07-10 HISTORY — DX: Type 2 diabetes mellitus without complications: E11.9

## 2020-07-10 HISTORY — DX: Tinea unguium: B35.1

## 2020-07-10 HISTORY — DX: Proteinuria, unspecified: R80.9

## 2020-07-10 HISTORY — DX: Myopia, unspecified eye: H52.10

## 2020-07-15 ENCOUNTER — Encounter: Payer: Self-pay | Admitting: Cardiology

## 2020-07-15 ENCOUNTER — Ambulatory Visit (INDEPENDENT_AMBULATORY_CARE_PROVIDER_SITE_OTHER): Payer: Medicare Other | Admitting: Cardiology

## 2020-07-15 ENCOUNTER — Other Ambulatory Visit: Payer: Self-pay

## 2020-07-15 VITALS — BP 136/84 | HR 74 | Ht 70.0 in | Wt 210.0 lb

## 2020-07-15 DIAGNOSIS — I10 Essential (primary) hypertension: Secondary | ICD-10-CM | POA: Diagnosis not present

## 2020-07-15 DIAGNOSIS — R079 Chest pain, unspecified: Secondary | ICD-10-CM

## 2020-07-15 DIAGNOSIS — E782 Mixed hyperlipidemia: Secondary | ICD-10-CM

## 2020-07-15 DIAGNOSIS — R0789 Other chest pain: Secondary | ICD-10-CM | POA: Diagnosis not present

## 2020-07-15 MED ORDER — METOPROLOL TARTRATE 100 MG PO TABS: ORAL_TABLET | ORAL | 0 refills | Status: DC
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
## 2020-07-15 NOTE — Patient Instructions (Signed)
Medication Instructions:  Your physician recommends that you continue on your current medications as directed. Please refer to the Current Medication list given to you today.  *If you need a refill on your cardiac medications before your next appointment, please call your pharmacy*   Lab Work: Your physician recommends that you return for lab work:  7-10 days before CT: BMP If you have labs (blood work) drawn today and your tests are completely normal, you will receive your results only by: Marland Kitchen MyChart Message (if you have MyChart) OR . A paper copy in the mail If you have any lab test that is abnormal or we need to change your treatment, we will call you to review the results.   Testing/Procedures: Your cardiac CT will be scheduled at:  [facility] [address] [place], [place] [address] [phone]  If scheduled at [facility], please arrive at the [facility] main entrance (entrance A) of [facility] 30 minutes prior to test start time. Proceed to the [facility] Radiology Department (first floor) to check-in and test prep.  Please follow these instructions carefully (unless otherwise directed):  Hold all erectile dysfunction medications at least 3 days (72 hrs) prior to test.  On the Night Before the Test: . Be sure to Drink plenty of water. . Do not consume any caffeinated/decaffeinated beverages or chocolate 12 hours prior to your test. . Do not take any antihistamines 12 hours prior to your test.   On the Day of the Test: . Drink plenty of water until 1 hour prior to the test. . Do not eat any food 4 hours prior to the test. . You may take your regular medications prior to the test.  . Take metoprolol (Lopressor) two hours prior to test.       After the Test: . Drink plenty of water. . After receiving IV contrast, you may experience a mild flushed feeling. This is normal. . On occasion, you may experience a mild rash up to 24 hours  after the test. This is not dangerous. If this occurs, you can take Benadryl 25 mg and increase your fluid intake. . If you experience trouble breathing, this can be serious. If it is severe call 911 IMMEDIATELY. If it is mild, please call our office.   Once we have confirmed authorization from your insurance company, we will call you to set up a date and time for your test. Based on how quickly your insurance processes prior authorizations requests, please allow up to 4 weeks to be contacted for scheduling your Cardiac CT appointment. Be advised that routine Cardiac CT appointments could be scheduled as many as 8 weeks after your provider has ordered it.  For non-scheduling related questions, please contact the cardiac imaging nurse navigator should you have any questions/concerns: Rockwell Alexandria, Cardiac Imaging Nurse Navigator Larey Brick, Cardiac Imaging Nurse Navigator [facility] Direct Office Dial: [phone]   For scheduling needs, including cancellations and rescheduling, please call [place], [phone].     Follow-Up: At [facility], you and your health needs are our priority.  As part of our continuing mission to provide you with exceptional heart care, we have created designated Provider Care Teams.  These Care Teams include your primary Cardiologist (physician) and Advanced Practice Providers (APPs -  Physician Assistants and Nurse Practitioners) who all work together to provide you with the care you need, when you need it.  We recommend signing up for the  patient portal called "MyChart".  Sign up information is provided on this After Visit Summary.  MyChart is used to connect with patients for Virtual Visits (Telemedicine).  Patients are able to view lab/test results, encounter notes, upcoming appointments, etc.  Non-urgent messages can be sent to your provider as well.   To learn more about what you can do with MyChart, go to  [email or web address].    Your next appointment:   6 month(s)  The format for your next appointment:   In Person  Provider:   Gypsy Balsam, MD   Other Instructions

## 2020-07-21 DIAGNOSIS — N401 Enlarged prostate with lower urinary tract symptoms: Secondary | ICD-10-CM | POA: Diagnosis not present

## 2020-07-21 DIAGNOSIS — R3912 Poor urinary stream: Secondary | ICD-10-CM | POA: Diagnosis not present

## 2020-07-21 DIAGNOSIS — R3915 Urgency of urination: Secondary | ICD-10-CM | POA: Diagnosis not present

## 2020-07-22 DIAGNOSIS — R3912 Poor urinary stream: Secondary | ICD-10-CM | POA: Diagnosis not present

## 2020-07-23 DIAGNOSIS — R0789 Other chest pain: Secondary | ICD-10-CM | POA: Diagnosis not present

## 2020-07-23 DIAGNOSIS — I10 Essential (primary) hypertension: Secondary | ICD-10-CM | POA: Diagnosis not present

## 2020-07-23 DIAGNOSIS — E782 Mixed hyperlipidemia: Secondary | ICD-10-CM | POA: Diagnosis not present

## 2020-07-24 LAB — BASIC METABOLIC PANEL
BUN/Creatinine Ratio: 26 — ABNORMAL HIGH (ref 10–24)
BUN: 23 mg/dL (ref 8–27)
CO2: 21 mmol/L (ref 20–29)
Calcium: 10.1 mg/dL (ref 8.6–10.2)
Chloride: 103 mmol/L (ref 96–106)
Creatinine, Ser: 0.88 mg/dL (ref 0.76–1.27)
Glucose: 110 mg/dL — ABNORMAL HIGH (ref 65–99)
Potassium: 4.2 mmol/L (ref 3.5–5.2)
Sodium: 140 mmol/L (ref 134–144)
eGFR: 94 mL/min/{1.73_m2} (ref >59)

## 2020-07-27 ENCOUNTER — Telehealth (HOSPITAL_COMMUNITY): Payer: Self-pay | Admitting: *Deleted

## 2020-07-27 NOTE — Telephone Encounter (Signed)
[facility] called in and stated they have not rec the Clearance as of yet.  They wanted to see if they can get it faxed over to [phone].  Pt did have an appt on 3/16 with DR K for Clearance   Best number [phone] ext [phone]

## 2020-07-27 NOTE — Telephone Encounter (Signed)
Please let the requesting team know that the patient was evaluated however will need to have a CCTA performed prior to his procedure. This appears to be scheduled for tomorrow, 07/28/20. Once this has resulted, hoping that it is normal, he will likely be cleared.   Thank you  Noreene Larsson

## 2020-07-27 NOTE — Telephone Encounter (Signed)
Reaching out to patient to offer assistance regarding upcoming cardiac imaging study; pt verbalizes understanding of appt date/time, parking situation and where to check in, pre-test NPO status and medications ordered, and verified current allergies; name and call back number provided for further questions should they arise  Larey Brick RN Navigator Cardiac Imaging [facility] [phone] office [phone] cell  Pt states that his reaction to nitroglycerin happened a long time ago, and they are not sure if it was the nitroglycerin that caused the loss of speech or something else.  Pt is agreeable to taking nitroglycerin for the cardiac CT.

## 2020-07-28 ENCOUNTER — Ambulatory Visit (HOSPITAL_COMMUNITY)
Admission: RE | Admit: 2020-07-28 | Discharge: 2020-07-28 | Disposition: A | Discharge status: Home / Self Care | Payer: Medicare Other | Source: Ambulatory Visit | Attending: Cardiology | Admitting: Cardiology

## 2020-07-28 ENCOUNTER — Other Ambulatory Visit: Payer: Self-pay

## 2020-07-28 DIAGNOSIS — K449 Diaphragmatic hernia without obstruction or gangrene: Secondary | ICD-10-CM | POA: Diagnosis not present

## 2020-07-28 DIAGNOSIS — K760 Fatty (change of) liver, not elsewhere classified: Secondary | ICD-10-CM | POA: Diagnosis not present

## 2020-07-28 DIAGNOSIS — R079 Chest pain, unspecified: Secondary | ICD-10-CM

## 2020-07-28 DIAGNOSIS — I700 Atherosclerosis of aorta: Secondary | ICD-10-CM | POA: Insufficient documentation

## 2020-07-28 DIAGNOSIS — I2510 Atherosclerotic heart disease of native coronary artery without angina pectoris: Secondary | ICD-10-CM | POA: Insufficient documentation

## 2020-07-28 DIAGNOSIS — R0789 Other chest pain: Secondary | ICD-10-CM | POA: Diagnosis not present

## 2020-07-28 MED ORDER — NITROGLYCERIN 0.4 MG SL SUBL
SUBLINGUAL_TABLET | SUBLINGUAL | Status: AC
  Filled 2020-07-28: qty 2

## 2020-07-28 MED ORDER — IOHEXOL 350 MG/ML SOLN
80.0000 mL | Freq: Once | INTRAVENOUS | Status: AC | PRN
  Administered 2020-07-28: 80 mL via INTRAVENOUS

## 2020-07-28 MED ORDER — NITROGLYCERIN 0.4 MG SL SUBL
0.8000 mg | SUBLINGUAL_TABLET | Freq: Once | SUBLINGUAL | Status: AC
  Administered 2020-07-28: 0.8 mg via SUBLINGUAL

## 2020-07-28 NOTE — Patient Instructions (Addendum)
DUE TO COVID-19 ONLY ONE VISITOR IS ALLOWED TO COME WITH YOU AND STAY IN THE WAITING ROOM ONLY DURING PRE OP AND PROCEDURE DAY OF SURGERY. THE 1 VISITOR  MAY VISIT WITH YOU AFTER SURGERY IN YOUR PRIVATE ROOM DURING VISITING HOURS ONLY!  YOU NEED TO HAVE A COVID 19 TEST ON_4/4______ @__8 :40_____, THIS TEST MUST BE DONE BEFORE SURGERY,  COVID TESTING SITE [address] [place] , IT IS ON THE RIGHT GOING OUT [address] APPROXIMATELY  2 MINUTES PAST [employer] ON THE RIGHT. ONCE YOUR COVID TEST IS COMPLETED,  PLEASE BEGIN THE QUARANTINE INSTRUCTIONS AS OUTLINED IN YOUR HANDOUT.                [id number]    Your procedure is scheduled on: 08/05/20   Report to [facility] Main  Entrance   Report to admitting at 6:30 AM     Call this number if you have problems the morning of surgery [phone]    Remember: Do not eat food or drink liquids :After Midnight.   BRUSH YOUR TEETH MORNING OF SURGERY AND RINSE YOUR MOUTH OUT, NO CHEWING GUM CANDY OR MINTS.     Take these medicines the morning of surgery with A SIP OF WATER: none                                 You may not have any metal on your body including               piercings  Do not wear jewelry,  lotions, powders or deodorant              Men may shave face and neck.   Do not bring valuables to the hospital. [facility] IS NOT             RESPONSIBLE   FOR VALUABLES.  Contacts, dentures or bridgework may not be worn into surgery.                  Please read over the following fact sheets you were given: _____________________________________________________________________             [facility] - Preparing for Surgery Before surgery, you can play an important role.  Because skin is not sterile, your skin needs to be as free of germs as possible.  You can reduce the number of germs on your skin by washing with CHG (chlorahexidine gluconate) soap before surgery.  CHG is an  antiseptic cleaner which kills germs and bonds with the skin to continue killing germs even after washing. Please DO NOT use if you have an allergy to CHG or antibacterial soaps.  If your skin becomes reddened/irritated stop using the CHG and inform your nurse when you arrive at Short Stay..  You may shave your face/neck. Please follow these instructions carefully:  1.  Shower with CHG Soap the night before surgery and the  morning of Surgery.  2.  If you choose to wash your hair, wash your hair first as usual with your  normal  shampoo.  3.  After you shampoo, rinse your hair and body thoroughly to remove the  shampoo.  4.  Use CHG as you would any other liquid soap.  You can apply chg directly  to the skin and wash                       Gently with a scrungie or clean washcloth.  5.  Apply the CHG Soap to your body ONLY FROM THE NECK DOWN.   Do not use on face/ open                           Wound or open sores. Avoid contact with eyes, ears mouth and genitals (private parts).                       Wash face,  Genitals (private parts) with your normal soap.             6.  Wash thoroughly, paying special attention to the area where your surgery  will be performed.  7.  Thoroughly rinse your body with warm water from the neck down.  8.  DO NOT shower/wash with your normal soap after using and rinsing off  the CHG Soap.             9.  Pat yourself dry with a clean towel.            10.  Wear clean pajamas.            11.  Place clean sheets on your bed the night of your first shower and do not  sleep with pets. Day of Surgery : Do not apply any lotions/deodorants the morning of surgery.  Please wear clean clothes to the hospital/surgery center.  FAILURE TO FOLLOW THESE INSTRUCTIONS MAY RESULT IN THE CANCELLATION OF YOUR SURGERY PATIENT SIGNATURE_________________________________  NURSE  SIGNATURE__________________________________  ________________________________________________________________________

## 2020-07-29 ENCOUNTER — Encounter (HOSPITAL_COMMUNITY): Payer: Self-pay

## 2020-07-29 ENCOUNTER — Other Ambulatory Visit: Payer: Self-pay

## 2020-07-29 ENCOUNTER — Encounter (HOSPITAL_COMMUNITY)
Admission: RE | Admit: 2020-07-29 | Discharge: 2020-07-29 | Disposition: A | Discharge status: Home / Self Care | Payer: Medicare Other | Source: Ambulatory Visit | Attending: Urology | Admitting: Urology

## 2020-07-29 DIAGNOSIS — Z01812 Encounter for preprocedural laboratory examination: Secondary | ICD-10-CM | POA: Insufficient documentation

## 2020-07-29 HISTORY — DX: Atherosclerotic heart disease of native coronary artery without angina pectoris: I25.10

## 2020-07-29 HISTORY — DX: Personal history of other diseases of the digestive system: Z87.19

## 2020-07-29 HISTORY — DX: Respiratory tuberculosis unspecified: A15.9

## 2020-07-29 LAB — BASIC METABOLIC PANEL
Anion gap: 8 (ref 5–15)
BUN: 25 mg/dL — ABNORMAL HIGH (ref 8–23)
CO2: 25 mmol/L (ref 22–32)
Calcium: 9.1 mg/dL (ref 8.9–10.3)
Chloride: 105 mmol/L (ref 98–111)
Creatinine, Ser: 0.93 mg/dL (ref 0.61–1.24)
GFR, Estimated: >60 mL/min (ref >60)
Glucose, Bld: 112 mg/dL — ABNORMAL HIGH (ref 70–99)
Potassium: 4 mmol/L (ref 3.5–5.1)
Sodium: 138 mmol/L (ref 135–145)

## 2020-07-29 LAB — CBC
HCT: 44 % (ref 39.0–52.0)
Hemoglobin: 14.6 g/dL (ref 13.0–17.0)
MCH: 29.9 pg (ref 26.0–34.0)
MCHC: 33.2 g/dL (ref 30.0–36.0)
MCV: 90 fL (ref 80.0–100.0)
Platelets: 151 10*3/uL (ref 150–400)
RBC: 4.89 MIL/uL (ref 4.22–5.81)
RDW: 13.3 % (ref 11.5–15.5)
WBC: 5.8 10*3/uL (ref 4.0–10.5)
nRBC: 0 % (ref 0.0–0.2)

## 2020-07-29 LAB — HEMOGLOBIN A1C
Hgb A1c MFr Bld: 6.2 % — ABNORMAL HIGH (ref 4.8–5.6)
Mean Plasma Glucose: 131.24 mg/dL

## 2020-07-29 NOTE — Progress Notes (Signed)
COVID Vaccine Completed:no Date COVID Vaccine completed: COVID vaccine manufacturer: Pfizer    [facility] [employer]   PCP - Dr. Tenna Child Cardiologist - Dr. Kandyce Rud  Chest x-ray - 08/13/20-epic EKG - 07/15/20-epic Stress Test - 08/23/19-epic ECHO - 11/22/19-epic Cardiac Cath - no Pacemaker/ICD device last checked:NA  Sleep Study - no CPAP -   Fasting Blood Sugar - NA diet controlled Checks Blood Sugar _____ times a day  Blood Thinner Instructions:ASA 81/dr. Drue Novel Aspirin Instructions:stop 5 days prior to DOS/ Berneice Heinrich Last Dose:07/27/20  Anesthesia review:   Patient denies shortness of breath, fever, cough and chest pain at PAT appointment yes  Patient verbalized understanding of instructions that were given to them at the PAT appointment. Patient was also instructed that they will need to review over the PAT instructions again at home before surgery.Yes Pt reports no SOB with any activities. He had a cardiac work up for atypical chest pain. CT was done 07/28/20

## 2020-07-30 ENCOUNTER — Ambulatory Visit (HOSPITAL_COMMUNITY)
Admission: RE | Admit: 2020-07-30 | Discharge: 2020-07-30 | Disposition: A | Discharge status: Home / Self Care | Payer: Medicare Other | Source: Ambulatory Visit | Attending: Cardiology | Admitting: Cardiology

## 2020-07-30 DIAGNOSIS — R9430 Abnormal result of cardiovascular function study, unspecified: Secondary | ICD-10-CM | POA: Diagnosis not present

## 2020-07-30 DIAGNOSIS — R079 Chest pain, unspecified: Secondary | ICD-10-CM | POA: Diagnosis not present

## 2020-07-30 DIAGNOSIS — I2510 Atherosclerotic heart disease of native coronary artery without angina pectoris: Secondary | ICD-10-CM | POA: Diagnosis not present
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
## 2020-07-30 NOTE — Telephone Encounter (Signed)
Called patient informed him that his ct was sent for ffr and when results are in we will call him. He understood no further questions.

## 2020-07-30 NOTE — Telephone Encounter (Signed)
    Selena with [facility] calling back, they would like to f/u clearance, she said pt got his 2 CT on 03/29 and today. They need to get clearance since pt's surgery is on 04/06 and they are requesting to held ASA 5 days prior procedure
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
## 2020-07-30 NOTE — Telephone Encounter (Signed)
Shane Fowler recently underwent coronary CTA.  Awaiting results of CT FFR.  He is requesting clearance for prostate procedure.  May his aspirin be held prior to his procedure?  Thank you for your help.  Please direct your response to CV DIV pool.  [name]. Crew Goren NP-C    07/30/2020, 1:44 PM [facility] [address] Office [phone] Fax [phone]

## 2020-07-30 NOTE — Telephone Encounter (Signed)
Awaiting results of CT FFR analysis.  We will be able to provide further recommendations after test is resulted.

## 2020-07-31 NOTE — Telephone Encounter (Signed)
Patient's ct FFR is normal per Dr. Bing Matter. Patient aware of this.

## 2020-08-03 ENCOUNTER — Telehealth: Payer: Self-pay | Admitting: Cardiology

## 2020-08-03 ENCOUNTER — Other Ambulatory Visit (HOSPITAL_COMMUNITY)
Admission: RE | Admit: 2020-08-03 | Discharge: 2020-08-03 | Disposition: A | Discharge status: Home / Self Care | Payer: Medicare Other | Source: Ambulatory Visit | Attending: Urology | Admitting: Urology

## 2020-08-03 DIAGNOSIS — Z01812 Encounter for preprocedural laboratory examination: Secondary | ICD-10-CM | POA: Insufficient documentation

## 2020-08-03 DIAGNOSIS — Z20822 Contact with and (suspected) exposure to covid-19: Secondary | ICD-10-CM | POA: Diagnosis not present

## 2020-08-03 LAB — SARS CORONAVIRUS 2 (TAT 6-24 HRS): SARS Coronavirus 2: NEGATIVE

## 2020-08-03 NOTE — Telephone Encounter (Signed)
Pre-op covering team, can you please notify requesting surgeon's office that we are waiting on FFR from recent coronary CTA to make sure patient does not have any significant CAD. As soon as this comes back and Dr. Mauri Brooklyn given his final recommendation, we will let them know.  Thank you!

## 2020-08-03 NOTE — Telephone Encounter (Signed)
Called and left a detailed voice message stating that we are awaiting the FFR results from recent Coronary CTA and recommendations from Dr. Mauri Brooklyn. Asked to give office a callback and ask for Pre-op pool or the Pre-op callback pool If there are any questions.

## 2020-08-03 NOTE — Telephone Encounter (Signed)
Follow Up:     Shane Fowler is calling to find out the status of pt's clearance. She needs this asap, pt surgery is Wednesday(08-05-20)

## 2020-08-04 DIAGNOSIS — R9430 Abnormal result of cardiovascular function study, unspecified: Secondary | ICD-10-CM | POA: Diagnosis not present

## 2020-08-04 DIAGNOSIS — I2510 Atherosclerotic heart disease of native coronary artery without angina pectoris: Secondary | ICD-10-CM | POA: Diagnosis not present

## 2020-08-04 NOTE — Telephone Encounter (Signed)
Received call from [facility] at Dr. Emmaline Life office for update on clearance from. I notified her that unfortunately we are still waiting on FFR. Procedure is scheduled for tomorrow morning so she states they are likely going to have to call patient to reschedule.   Dr. Bing Matter, I cannot see the FFR results in our system? Is there any other way to check on this (just want to make sure I am not missing something)? Please route response back to P CV DIV PREOP.  Thank you! Jaydis Duchene

## 2020-08-04 NOTE — Anesthesia Preprocedure Evaluation (Addendum)
Anesthesia Evaluation  Patient identified by MRN, date of birth, ID band Patient awake    Reviewed: Allergy & Precautions, NPO status , Patient's Chart, lab work & pertinent test results, reviewed documented beta blocker date and time   Airway Mallampati: II  TM Distance: >3 FB Neck ROM: Full    Dental  (+) Edentulous Lower, Edentulous Upper   Pulmonary neg pulmonary ROS, former smoker,  Quit smoking 1974   Pulmonary exam normal breath sounds clear to auscultation       Cardiovascular hypertension, Pt. on medications and Pt. on home beta blockers + CAD  negative cardio ROS Normal cardiovascular exam Rhythm:Regular Rate:Normal     Neuro/Psych  Headaches, negative neurological ROS  negative psych ROS   GI/Hepatic negative GI ROS, Neg liver ROS, hiatal hernia,   Endo/Other  negative endocrine ROS  Renal/GU negative Renal ROS   BPH negative genitourinary   Musculoskeletal negative musculoskeletal ROS (+)   Abdominal (+) + obese,   Peds negative pediatric ROS (+)  Hematology negative hematology ROS (+) hct 44, plt 151   Anesthesia Other Findings   Reproductive/Obstetrics negative OB ROS                           Anesthesia Physical Anesthesia Plan  ASA: III  Anesthesia Plan: General   Post-op Pain Management:    Induction: Intravenous  PONV Risk Score and Plan: 2 and Ondansetron, Midazolam and Treatment may vary due to age or medical condition  Airway Management Planned: Oral ETT  Additional Equipment: None  Intra-op Plan:   Post-operative Plan: Extubation in OR  Informed Consent: I have reviewed the patients History and Physical, chart, labs and discussed the procedure including the risks, benefits and alternatives for the proposed anesthesia with the patient or authorized representative who has indicated his/her understanding and acceptance.     Dental advisory  given  Plan Discussed with: CRNA  Anesthesia Plan Comments:        Anesthesia Quick Evaluation

## 2020-08-05 ENCOUNTER — Ambulatory Visit: Payer: Medicare Other | Admitting: Cardiology

## 2020-08-05 NOTE — Telephone Encounter (Signed)
Sent completed risk assessment form back to requesting surgeon's office earlier this morning. Please see other phone note for information.  Corrin Parker, PA-C 08/05/2020 9:55 AM

## 2020-08-05 NOTE — Telephone Encounter (Signed)
   Patient Name: Shane Fowler  DOB: 31-Mar-1951  MRN: [id number]   Primary Cardiologist: Gypsy Balsam, MD  Chart reviewed as part of pre-operative protocol coverage. Patient was recently seen by Dr. Bing Matter on 07/15/2020 for pre-op evaluation and reported some atypical chest pain at that time. Coronary CTA was ordered for further evaluation and showed moderate non-obstructive CAD (FFR was negative). Therefore, per Dr. Bing Matter, OK to proceed with surgery.  I will route this recommendation to the requesting party via Epic fax function and remove from pre-op pool.  Please call with questions.  Corrin Parker, PA-C 08/05/2020, 8:27 AM

## 2020-08-05 NOTE — Telephone Encounter (Signed)
His FFR was fine.  So, there is no problem for him to proceed with surgery

## 2020-08-05 NOTE — Telephone Encounter (Signed)
Called patient informed him that per Dr. Bing Matter his CT is good and proceed with surgery.

## 2020-08-13 ENCOUNTER — Encounter: Payer: Self-pay | Admitting: Internal Medicine

## 2020-08-13 ENCOUNTER — Encounter (HOSPITAL_COMMUNITY): Payer: Self-pay | Admitting: Urology

## 2020-08-13 ENCOUNTER — Other Ambulatory Visit: Payer: Self-pay

## 2020-08-15 ENCOUNTER — Encounter: Payer: Self-pay | Admitting: Internal Medicine

## 2020-08-18 MED ORDER — GLUCOSE BLOOD VI STRP: ORAL_STRIP | 12 refills | Status: DC

## 2020-08-18 MED ORDER — ACCU-CHEK SOFTCLIX LANCETS MISC: 12 refills | Status: DC

## 2020-08-18 MED ORDER — ACCU-CHEK NANO SMARTVIEW W/DEVICE KIT: PACK | 0 refills | Status: DC

## 2020-08-19 ENCOUNTER — Telehealth: Payer: Self-pay | Admitting: Internal Medicine

## 2020-08-19 ENCOUNTER — Encounter: Payer: Self-pay | Admitting: Internal Medicine

## 2020-08-19 ENCOUNTER — Other Ambulatory Visit (HOSPITAL_COMMUNITY)
Admission: RE | Admit: 2020-08-19 | Discharge: 2020-08-19 | Disposition: A | Discharge status: Home / Self Care | Payer: Medicare Other | Source: Ambulatory Visit | Attending: Urology | Admitting: Urology

## 2020-08-19 DIAGNOSIS — Z01812 Encounter for preprocedural laboratory examination: Secondary | ICD-10-CM | POA: Diagnosis not present

## 2020-08-19 DIAGNOSIS — Z20822 Contact with and (suspected) exposure to covid-19: Secondary | ICD-10-CM | POA: Diagnosis not present

## 2020-08-19 LAB — SARS CORONAVIRUS 2 (TAT 6-24 HRS): SARS Coronavirus 2: NEGATIVE

## 2020-08-19 NOTE — Telephone Encounter (Signed)
Pt dropped off document for provider to see and have on pt's chart ([facility] in a large yellow envelope) Documents put at front office tray under providers name.

## 2020-08-19 NOTE — Telephone Encounter (Signed)
Received

## 2020-08-21 ENCOUNTER — Ambulatory Visit (HOSPITAL_COMMUNITY): Payer: Medicare Other | Admitting: Anesthesiology

## 2020-08-21 ENCOUNTER — Observation Stay (HOSPITAL_COMMUNITY)
Admission: RE | Admit: 2020-08-21 | Discharge: 2020-08-22 | Disposition: A | Discharge status: Home / Self Care | Payer: Medicare Other | Source: Other Acute Inpatient Hospital | Attending: Urology | Admitting: Urology

## 2020-08-21 ENCOUNTER — Encounter (HOSPITAL_COMMUNITY): Payer: Self-pay | Admitting: Urology

## 2020-08-21 ENCOUNTER — Other Ambulatory Visit: Payer: Self-pay

## 2020-08-21 ENCOUNTER — Ambulatory Visit (HOSPITAL_COMMUNITY): Payer: Medicare Other | Admitting: Physician Assistant

## 2020-08-21 ENCOUNTER — Encounter (HOSPITAL_COMMUNITY)
Admission: RE | Disposition: A | Discharge status: Home / Self Care | Payer: Self-pay | Source: Other Acute Inpatient Hospital | Attending: Urology

## 2020-08-21 DIAGNOSIS — N138 Other obstructive and reflux uropathy: Secondary | ICD-10-CM | POA: Diagnosis present

## 2020-08-21 DIAGNOSIS — Z905 Acquired absence of kidney: Secondary | ICD-10-CM | POA: Diagnosis not present

## 2020-08-21 DIAGNOSIS — I10 Essential (primary) hypertension: Secondary | ICD-10-CM | POA: Diagnosis not present

## 2020-08-21 DIAGNOSIS — N390 Urinary tract infection, site not specified: Secondary | ICD-10-CM | POA: Insufficient documentation

## 2020-08-21 DIAGNOSIS — Z87891 Personal history of nicotine dependence: Secondary | ICD-10-CM | POA: Insufficient documentation

## 2020-08-21 DIAGNOSIS — E559 Vitamin D deficiency, unspecified: Secondary | ICD-10-CM | POA: Diagnosis not present

## 2020-08-21 DIAGNOSIS — I2510 Atherosclerotic heart disease of native coronary artery without angina pectoris: Secondary | ICD-10-CM | POA: Diagnosis not present

## 2020-08-21 DIAGNOSIS — E782 Mixed hyperlipidemia: Secondary | ICD-10-CM | POA: Diagnosis not present

## 2020-08-21 DIAGNOSIS — N400 Enlarged prostate without lower urinary tract symptoms: Secondary | ICD-10-CM | POA: Diagnosis present

## 2020-08-21 DIAGNOSIS — N401 Enlarged prostate with lower urinary tract symptoms: Secondary | ICD-10-CM | POA: Diagnosis not present

## 2020-08-21 DIAGNOSIS — N3289 Other specified disorders of bladder: Secondary | ICD-10-CM | POA: Diagnosis not present

## 2020-08-21 HISTORY — PX: XI ROBOTIC ASSISTED SIMPLE PROSTATECTOMY: SHX6713

## 2020-08-21 LAB — CBC
HCT: 46 % (ref 39.0–52.0)
Hemoglobin: 15.5 g/dL (ref 13.0–17.0)
MCH: 30 pg (ref 26.0–34.0)
MCHC: 33.7 g/dL (ref 30.0–36.0)
MCV: 89.1 fL (ref 80.0–100.0)
Platelets: 161 10*3/uL (ref 150–400)
RBC: 5.16 MIL/uL (ref 4.22–5.81)
RDW: 13.6 % (ref 11.5–15.5)
WBC: 5.7 10*3/uL (ref 4.0–10.5)
nRBC: 0 % (ref 0.0–0.2)

## 2020-08-21 LAB — BASIC METABOLIC PANEL
Anion gap: 10 (ref 5–15)
BUN: 16 mg/dL (ref 8–23)
CO2: 25 mmol/L (ref 22–32)
Calcium: 9.4 mg/dL (ref 8.9–10.3)
Chloride: 107 mmol/L (ref 98–111)
Creatinine, Ser: 0.88 mg/dL (ref 0.61–1.24)
GFR, Estimated: >60 mL/min (ref >60)
Glucose, Bld: 124 mg/dL — ABNORMAL HIGH (ref 70–99)
Potassium: 4.2 mmol/L (ref 3.5–5.1)
Sodium: 142 mmol/L (ref 135–145)

## 2020-08-21 LAB — HEMOGLOBIN AND HEMATOCRIT, BLOOD
HCT: 39.9 % (ref 39.0–52.0)
Hemoglobin: 13.1 g/dL (ref 13.0–17.0)

## 2020-08-21 LAB — GLUCOSE, CAPILLARY
Glucose-Capillary: 189 mg/dL — ABNORMAL HIGH (ref 70–99)
Glucose-Capillary: 187 mg/dL — ABNORMAL HIGH (ref 70–99)

## 2020-08-21 SURGERY — PROSTATECTOMY, SIMPLE, ROBOT-ASSISTED
Anesthesia: General

## 2020-08-21 MED ORDER — ATORVASTATIN CALCIUM 40 MG PO TABS
80.0000 mg | ORAL_TABLET | Freq: Every day | ORAL | Status: DC
  Administered 2020-08-22: 80 mg via ORAL
  Filled 2020-08-21: qty 2

## 2020-08-21 MED ORDER — FENTANYL CITRATE (PF) 250 MCG/5ML IJ SOLN
INTRAMUSCULAR | Status: DC | PRN
  Administered 2020-08-21 (×6): 50 ug via INTRAVENOUS

## 2020-08-21 MED ORDER — SODIUM CHLORIDE 0.9 % IV BOLUS: 1000.0000 mL | Freq: Once | INTRAVENOUS | Status: DC

## 2020-08-21 MED ORDER — DIPHENHYDRAMINE HCL 50 MG/ML IJ SOLN: 12.5000 mg | Freq: Four times a day (QID) | INTRAMUSCULAR | Status: DC | PRN

## 2020-08-21 MED ORDER — LACTATED RINGERS IR SOLN
Status: DC | PRN
  Administered 2020-08-21: 1000 mL

## 2020-08-21 MED ORDER — OXYCODONE HCL 5 MG/5ML PO SOLN
5.0000 mg | Freq: Once | ORAL | Status: DC | PRN
Start: 2020-08-21 — End: 2020-08-21

## 2020-08-21 MED ORDER — ACETAMINOPHEN 500 MG PO TABS
1000.0000 mg | ORAL_TABLET | Freq: Four times a day (QID) | ORAL | Status: AC
  Administered 2020-08-21 – 2020-08-22 (×4): 1000 mg via ORAL
  Filled 2020-08-21 (×3): qty 2

## 2020-08-21 MED ORDER — SUGAMMADEX SODIUM 200 MG/2ML IV SOLN
INTRAVENOUS | Status: DC | PRN
  Administered 2020-08-21: 200 mg via INTRAVENOUS

## 2020-08-21 MED ORDER — FENTANYL CITRATE (PF) 100 MCG/2ML IJ SOLN
INTRAMUSCULAR | Status: AC
  Filled 2020-08-21: qty 2

## 2020-08-21 MED ORDER — DOCUSATE SODIUM 100 MG PO CAPS
100.0000 mg | ORAL_CAPSULE | Freq: Two times a day (BID) | ORAL | Status: DC
Start: 2020-08-21 — End: 2020-08-22
  Administered 2020-08-22: 100 mg via ORAL
  Filled 2020-08-21 (×2): qty 1

## 2020-08-21 MED ORDER — BUPIVACAINE LIPOSOME 1.3 % IJ SUSP
20.0000 mL | Freq: Once | INTRAMUSCULAR | Status: AC
  Administered 2020-08-21: 20 mL
  Filled 2020-08-21: qty 20

## 2020-08-21 MED ORDER — DEXAMETHASONE SODIUM PHOSPHATE 10 MG/ML IJ SOLN
INTRAMUSCULAR | Status: DC | PRN
  Administered 2020-08-21: 8 mg via INTRAVENOUS

## 2020-08-21 MED ORDER — PROMETHAZINE HCL 25 MG/ML IJ SOLN
6.2500 mg | INTRAMUSCULAR | Status: DC | PRN
Start: 2020-08-21 — End: 2020-08-21

## 2020-08-21 MED ORDER — ROCURONIUM BROMIDE 10 MG/ML (PF) SYRINGE
PREFILLED_SYRINGE | INTRAVENOUS | Status: AC
  Filled 2020-08-21: qty 10

## 2020-08-21 MED ORDER — HYDROMORPHONE HCL 2 MG/ML IJ SOLN
INTRAMUSCULAR | Status: AC
  Filled 2020-08-21: qty 1

## 2020-08-21 MED ORDER — DOCUSATE SODIUM 100 MG PO CAPS: 100.0000 mg | ORAL_CAPSULE | Freq: Two times a day (BID) | ORAL | Status: DC

## 2020-08-21 MED ORDER — PROPOFOL 10 MG/ML IV BOLUS
INTRAVENOUS | Status: AC
  Filled 2020-08-21: qty 20

## 2020-08-21 MED ORDER — ALBUMIN HUMAN 5 % IV SOLN: INTRAVENOUS | Status: DC | PRN

## 2020-08-21 MED ORDER — SULFAMETHOXAZOLE-TRIMETHOPRIM 800-160 MG PO TABS: 1.0000 | ORAL_TABLET | Freq: Two times a day (BID) | ORAL | 0 refills | Status: DC

## 2020-08-21 MED ORDER — ONDANSETRON HCL 4 MG/2ML IJ SOLN
INTRAMUSCULAR | Status: AC
  Filled 2020-08-21: qty 2

## 2020-08-21 MED ORDER — ONDANSETRON HCL 4 MG/2ML IJ SOLN: 4.0000 mg | INTRAMUSCULAR | Status: DC | PRN

## 2020-08-21 MED ORDER — PHENYLEPHRINE HCL (PRESSORS) 10 MG/ML IV SOLN
INTRAVENOUS | Status: AC
  Filled 2020-08-21: qty 1

## 2020-08-21 MED ORDER — LIDOCAINE 2% (20 MG/ML) 5 ML SYRINGE
INTRAMUSCULAR | Status: AC
  Filled 2020-08-21: qty 5

## 2020-08-21 MED ORDER — METOPROLOL TARTRATE 25 MG PO TABS
25.0000 mg | ORAL_TABLET | Freq: Two times a day (BID) | ORAL | Status: DC
  Administered 2020-08-21 – 2020-08-22 (×2): 25 mg via ORAL
  Filled 2020-08-21 (×2): qty 1

## 2020-08-21 MED ORDER — SODIUM CHLORIDE 0.45 % IV SOLN: INTRAVENOUS | Status: DC

## 2020-08-21 MED ORDER — BELLADONNA ALKALOIDS-OPIUM 16.2-60 MG RE SUPP: 1.0000 | Freq: Four times a day (QID) | RECTAL | Status: DC | PRN

## 2020-08-21 MED ORDER — EPHEDRINE SULFATE-NACL 50-0.9 MG/10ML-% IV SOSY
PREFILLED_SYRINGE | INTRAVENOUS | Status: DC | PRN
  Administered 2020-08-21: 15 mg via INTRAVENOUS

## 2020-08-21 MED ORDER — DEXAMETHASONE SODIUM PHOSPHATE 10 MG/ML IJ SOLN
INTRAMUSCULAR | Status: AC
  Filled 2020-08-21: qty 1

## 2020-08-21 MED ORDER — HYDROMORPHONE HCL 1 MG/ML IJ SOLN
INTRAMUSCULAR | Status: AC
  Filled 2020-08-21: qty 1

## 2020-08-21 MED ORDER — KETAMINE HCL 10 MG/ML IJ SOLN
INTRAMUSCULAR | Status: DC | PRN
  Administered 2020-08-21: 50 mg via INTRAVENOUS

## 2020-08-21 MED ORDER — LABETALOL HCL 5 MG/ML IV SOLN
INTRAVENOUS | Status: DC | PRN
  Administered 2020-08-21: 5 mg via INTRAVENOUS

## 2020-08-21 MED ORDER — FINASTERIDE 5 MG PO TABS: 5.0000 mg | ORAL_TABLET | Freq: Every day | ORAL | 5 refills | Status: AC

## 2020-08-21 MED ORDER — MAGNESIUM CITRATE PO SOLN
1.0000 | Freq: Once | ORAL | Status: DC
  Filled 2020-08-21: qty 296

## 2020-08-21 MED ORDER — PROPOFOL 10 MG/ML IV BOLUS
INTRAVENOUS | Status: DC | PRN
  Administered 2020-08-21: 150 mg via INTRAVENOUS

## 2020-08-21 MED ORDER — HYDROMORPHONE HCL 1 MG/ML IJ SOLN
0.2500 mg | INTRAMUSCULAR | Status: DC | PRN
  Administered 2020-08-21: 0.5 mg via INTRAVENOUS

## 2020-08-21 MED ORDER — ORAL CARE MOUTH RINSE: 15.0000 mL | Freq: Once | OROMUCOSAL | Status: AC

## 2020-08-21 MED ORDER — DIPHENHYDRAMINE HCL 12.5 MG/5ML PO ELIX: 12.5000 mg | ORAL_SOLUTION | Freq: Four times a day (QID) | ORAL | Status: DC | PRN

## 2020-08-21 MED ORDER — AMISULPRIDE (ANTIEMETIC) 5 MG/2ML IV SOLN: 10.0000 mg | Freq: Once | INTRAVENOUS | Status: DC | PRN

## 2020-08-21 MED ORDER — CHLORHEXIDINE GLUCONATE 0.12 % MT SOLN
15.0000 mL | Freq: Once | OROMUCOSAL | Status: AC
  Administered 2020-08-21: 15 mL via OROMUCOSAL

## 2020-08-21 MED ORDER — HYDROCODONE-ACETAMINOPHEN 5-325 MG PO TABS: 1.0000 | ORAL_TABLET | Freq: Four times a day (QID) | ORAL | 0 refills | Status: DC | PRN

## 2020-08-21 MED ORDER — MIDAZOLAM HCL 2 MG/2ML IJ SOLN
INTRAMUSCULAR | Status: AC
  Filled 2020-08-21: qty 2

## 2020-08-21 MED ORDER — OXYCODONE HCL 5 MG PO TABS: 5.0000 mg | ORAL_TABLET | ORAL | Status: DC | PRN

## 2020-08-21 MED ORDER — MEPERIDINE HCL 50 MG/ML IJ SOLN: 6.2500 mg | INTRAMUSCULAR | Status: DC | PRN

## 2020-08-21 MED ORDER — CEFAZOLIN SODIUM-DEXTROSE 2-4 GM/100ML-% IV SOLN
2.0000 g | INTRAVENOUS | Status: AC
  Administered 2020-08-21: 2 g via INTRAVENOUS
  Filled 2020-08-21: qty 100

## 2020-08-21 MED ORDER — POLYVINYL ALCOHOL 1.4 % OP SOLN
1.0000 [drp] | Freq: Three times a day (TID) | OPHTHALMIC | Status: DC | PRN
  Filled 2020-08-21: qty 15

## 2020-08-21 MED ORDER — BACITRACIN-NEOMYCIN-POLYMYXIN 400-5-5000 EX OINT: 1.0000 "application " | TOPICAL_OINTMENT | Freq: Three times a day (TID) | CUTANEOUS | Status: DC | PRN

## 2020-08-21 MED ORDER — EPHEDRINE 5 MG/ML INJ
INTRAVENOUS | Status: AC
  Filled 2020-08-21: qty 20

## 2020-08-21 MED ORDER — SODIUM CHLORIDE (PF) 0.9 % IJ SOLN
INTRAMUSCULAR | Status: AC
  Filled 2020-08-21: qty 20

## 2020-08-21 MED ORDER — LACTATED RINGERS IV SOLN: INTRAVENOUS | Status: DC | PRN

## 2020-08-21 MED ORDER — LIDOCAINE 2% (20 MG/ML) 5 ML SYRINGE
INTRAMUSCULAR | Status: DC | PRN
  Administered 2020-08-21: 100 mg via INTRAVENOUS

## 2020-08-21 MED ORDER — INSULIN ASPART 100 UNIT/ML ~~LOC~~ SOLN
0.0000 [IU] | Freq: Three times a day (TID) | SUBCUTANEOUS | Status: DC
  Administered 2020-08-22 (×2): 2 [IU] via SUBCUTANEOUS

## 2020-08-21 MED ORDER — MIDAZOLAM HCL 5 MG/5ML IJ SOLN
INTRAMUSCULAR | Status: DC | PRN
  Administered 2020-08-21: 2 mg via INTRAVENOUS

## 2020-08-21 MED ORDER — OXYCODONE HCL 5 MG PO TABS: 5.0000 mg | ORAL_TABLET | Freq: Once | ORAL | Status: DC | PRN

## 2020-08-21 MED ORDER — ROCURONIUM BROMIDE 10 MG/ML (PF) SYRINGE
PREFILLED_SYRINGE | INTRAVENOUS | Status: DC | PRN
  Administered 2020-08-21: 20 mg via INTRAVENOUS
  Administered 2020-08-21: 100 mg via INTRAVENOUS

## 2020-08-21 MED ORDER — SODIUM CHLORIDE (PF) 0.9 % IJ SOLN
INTRAMUSCULAR | Status: DC | PRN
  Administered 2020-08-21: 20 mL

## 2020-08-21 MED ORDER — ACETAMINOPHEN 500 MG PO TABS
1000.0000 mg | ORAL_TABLET | Freq: Once | ORAL | Status: AC
  Administered 2020-08-21: 1000 mg via ORAL
  Filled 2020-08-21: qty 2

## 2020-08-21 MED ORDER — ONDANSETRON HCL 4 MG/2ML IJ SOLN
INTRAMUSCULAR | Status: DC | PRN
  Administered 2020-08-21: 4 mg via INTRAVENOUS

## 2020-08-21 MED ORDER — HYDROMORPHONE HCL 1 MG/ML IJ SOLN: 0.5000 mg | INTRAMUSCULAR | Status: DC | PRN

## 2020-08-21 MED ORDER — PHENYLEPHRINE 40 MCG/ML (10ML) SYRINGE FOR IV PUSH (FOR BLOOD PRESSURE SUPPORT)
PREFILLED_SYRINGE | INTRAVENOUS | Status: AC
  Filled 2020-08-21: qty 10

## 2020-08-21 MED ORDER — BELLADONNA ALKALOIDS-OPIUM 16.2-60 MG RE SUPP
RECTAL | Status: AC
  Administered 2020-08-21: 1 via RECTAL
  Filled 2020-08-21: qty 1

## 2020-08-21 MED ORDER — LACTATED RINGERS IV SOLN: INTRAVENOUS | Status: DC

## 2020-08-21 MED ORDER — ESMOLOL HCL 100 MG/10ML IV SOLN
INTRAVENOUS | Status: AC
  Filled 2020-08-21: qty 20

## 2020-08-21 MED ORDER — FINASTERIDE 5 MG PO TABS
5.0000 mg | ORAL_TABLET | Freq: Every day | ORAL | Status: DC
  Administered 2020-08-21 – 2020-08-22 (×2): 5 mg via ORAL
  Filled 2020-08-21 (×2): qty 1

## 2020-08-21 SURGICAL SUPPLY — 68 items
APPLICATOR COTTON TIP 6 STRL (MISCELLANEOUS) ×1 IMPLANT
APPLICATOR COTTON TIP 6IN STRL (MISCELLANEOUS) ×2
CATH FOLEY 2WAY SLVR 18FR 30CC (CATHETERS) ×2 IMPLANT
CATH FOLEY 3WAY 30CC 24FR (CATHETERS) ×2
CATH TIEMANN FOLEY 18FR 5CC (CATHETERS) IMPLANT
CATH URTH STD 24FR FL 3W 2 (CATHETERS) ×1 IMPLANT
CHLORAPREP W/TINT 26 (MISCELLANEOUS) ×2 IMPLANT
CLIP VESOLOCK LG 6/CT PURPLE (CLIP) IMPLANT
CLOTH BEACON ORANGE TIMEOUT ST (SAFETY) ×2 IMPLANT
COVER SURGICAL LIGHT HANDLE (MISCELLANEOUS) ×2 IMPLANT
COVER TIP SHEARS 8 DVNC (MISCELLANEOUS) ×1 IMPLANT
COVER TIP SHEARS 8MM DA VINCI (MISCELLANEOUS) ×2
COVER WAND RF STERILE (DRAPES) IMPLANT
CUTTER ECHEON FLEX ENDO 45 340 (ENDOMECHANICALS) IMPLANT
DECANTER SPIKE VIAL GLASS SM (MISCELLANEOUS) ×2 IMPLANT
DERMABOND ADVANCED (GAUZE/BANDAGES/DRESSINGS) ×1
DERMABOND ADVANCED .7 DNX12 (GAUZE/BANDAGES/DRESSINGS) ×1 IMPLANT
DRAIN CHANNEL RND F F (WOUND CARE) IMPLANT
DRAPE ARM DVNC X/XI (DISPOSABLE) ×4 IMPLANT
DRAPE COLUMN DVNC XI (DISPOSABLE) ×1 IMPLANT
DRAPE DA VINCI XI ARM (DISPOSABLE) ×8
DRAPE DA VINCI XI COLUMN (DISPOSABLE) ×2
DRAPE SURG IRRIG POUCH 19X23 (DRAPES) ×2 IMPLANT
DRSG TEGADERM 4X4.75 (GAUZE/BANDAGES/DRESSINGS) ×4 IMPLANT
ELECT PENCIL ROCKER SW 15FT (MISCELLANEOUS) ×2 IMPLANT
ELECT REM PT RETURN 15FT ADLT (MISCELLANEOUS) ×2 IMPLANT
GLOVE SURG ENC MOIS LTX SZ6.5 (GLOVE) ×2 IMPLANT
GLOVE SURG ENC TEXT LTX SZ7.5 (GLOVE) ×4 IMPLANT
GLOVE SURG UNDER POLY LF SZ7.5 (GLOVE) ×2 IMPLANT
GOWN STRL REUS W/TWL LRG LVL3 (GOWN DISPOSABLE) ×6 IMPLANT
HOLDER FOLEY CATH W/STRAP (MISCELLANEOUS) ×2 IMPLANT
IRRIG SUCT STRYKERFLOW 2 WTIP (MISCELLANEOUS) ×2
IRRIGATION SUCT STRKRFLW 2 WTP (MISCELLANEOUS) ×1 IMPLANT
IV LACTATED RINGERS 1000ML (IV SOLUTION) ×2 IMPLANT
KIT TURNOVER KIT A (KITS) ×2 IMPLANT
NEEDLE INSUFFLATION 14GA 120MM (NEEDLE) ×2 IMPLANT
PACK ROBOT UROLOGY CUSTOM (CUSTOM PROCEDURE TRAY) ×2 IMPLANT
PAD POSITIONING PINK XL (MISCELLANEOUS) ×2 IMPLANT
PENCIL SMOKE EVACUATOR (MISCELLANEOUS) IMPLANT
PORT ACCESS TROCAR AIRSEAL 12 (TROCAR) ×1 IMPLANT
PORT ACCESS TROCAR AIRSEAL 5M (TROCAR) ×1
POUCH SPECIMEN RETRIEVAL 10MM (ENDOMECHANICALS) ×4 IMPLANT
SEAL CANN UNIV 5-8 DVNC XI (MISCELLANEOUS) ×4 IMPLANT
SEAL XI 5MM-8MM UNIVERSAL (MISCELLANEOUS) ×8
SET TRI-LUMEN FLTR TB AIRSEAL (TUBING) ×2 IMPLANT
SOLUTION ELECTROLUBE (MISCELLANEOUS) ×2 IMPLANT
SPONGE LAP 4X18 RFD (DISPOSABLE) ×2 IMPLANT
STAPLE RELOAD 45 GRN (STAPLE) ×1 IMPLANT
STAPLE RELOAD 45MM GREEN (STAPLE) ×2
SUT ETHILON 3 0 PS 1 (SUTURE) ×2 IMPLANT
SUT MNCRL AB 4-0 PS2 18 (SUTURE) ×4 IMPLANT
SUT PDS AB 1 CT1 27 (SUTURE) ×4 IMPLANT
SUT V-LOC BARB 180 2/0GR6 GS22 (SUTURE) ×4
SUT VIC AB 0 CT1 27 (SUTURE) ×6
SUT VIC AB 0 CT1 27XBRD ANTBC (SUTURE) ×3 IMPLANT
SUT VIC AB 2-0 CT1 27 (SUTURE) ×2
SUT VIC AB 2-0 CT1 36 (SUTURE) ×2 IMPLANT
SUT VIC AB 2-0 CT1 TAPERPNT 27 (SUTURE) ×1 IMPLANT
SUT VIC AB 2-0 SH 27 (SUTURE) ×2
SUT VIC AB 2-0 SH 27X BRD (SUTURE) ×1 IMPLANT
SUT VICRYL 0 UR6 27IN ABS (SUTURE) ×2 IMPLANT
SUT VLOC BARB 180 ABS3/0GR12 (SUTURE) ×4
SUTURE V-LC BRB 180 2/0GR6GS22 (SUTURE) ×2 IMPLANT
SUTURE VLOC BRB 180 ABS3/0GR12 (SUTURE) ×2 IMPLANT
TOWEL OR NON WOVEN STRL DISP B (DISPOSABLE) ×2 IMPLANT
TROCAR BLADELESS OPT 5 100 (ENDOMECHANICALS) IMPLANT
TROCAR XCEL NON-BLD 5MMX100MML (ENDOMECHANICALS) IMPLANT
WATER STERILE IRR 1000ML POUR (IV SOLUTION) ×2 IMPLANT

## 2020-08-21 NOTE — Brief Op Note (Signed)
08/21/2020  1:40 PM  PATIENT:  Shane Fowler  70 y.o. male  PRE-OPERATIVE DIAGNOSIS:  VERY LARGE PROSTATE  POST-OPERATIVE DIAGNOSIS:  VERY LARGE PROSTATE  PROCEDURE:  Procedure(s) with comments: XI ROBOTIC ASSISTED SIMPLE PROSTATECTOMY (N/A) - 3 HRS  SURGEON:  Surgeon(s) and Role:    * Sebastian Ache, MD - Primary  PHYSICIAN ASSISTANT:   ASSISTANTS: Harrie Foreman PA   ANESTHESIA:   local and general  EBL:  500 mL   BLOOD ADMINISTERED:none  DRAINS: 1 - JP to bulb/ 2- Foley to gravity   LOCAL MEDICATIONS USED:  MARCAINE     SPECIMEN:  Source of Specimen:  prostate adenoma  DISPOSITION OF SPECIMEN:  PATHOLOGY  COUNTS:  YES  TOURNIQUET:  * No tourniquets in log *  DICTATION: .Other Dictation: Dictation Number  [id number]  PLAN OF CARE: Admit to inpatient   PATIENT DISPOSITION:  PACU - hemodynamically stable.   Delay start of Pharmacological VTE agent (>24hrs) due to surgical blood loss or risk of bleeding: yes
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
## 2020-08-21 NOTE — Anesthesia Procedure Notes (Signed)
Procedure Name: Intubation Performed by: Sudie Grumbling, CRNA Pre-anesthesia Checklist: Patient identified, Emergency Drugs available, Suction available and Patient being monitored Patient Re-evaluated:Patient Re-evaluated prior to induction Oxygen Delivery Method: Circle system utilized Preoxygenation: Pre-oxygenation with 100% oxygen Induction Type: IV induction Ventilation: Mask ventilation without difficulty and Oral airway inserted - appropriate to patient size Laryngoscope Size: Hyacinth Meeker and 3 Grade View: Grade I Tube type: Oral Tube size: 7.5 mm Number of attempts: 1 Airway Equipment and Method: Stylet and Oral airway Placement Confirmation: ETT inserted through vocal cords under direct vision,  positive ETCO2 and breath sounds checked- equal and bilateral Secured at: 22 cm Tube secured with: Tape Dental Injury: Teeth and Oropharynx as per pre-operative assessment

## 2020-08-21 NOTE — Transfer of Care (Signed)
Immediate Anesthesia Transfer of Care Note  Patient: Shane Fowler  Procedure(s) Performed: XI ROBOTIC ASSISTED SIMPLE PROSTATECTOMY (N/A )  Patient Location: PACU  Anesthesia Type:General  Level of Consciousness: awake, alert  and oriented  Airway & Oxygen Therapy: Patient Spontanous Breathing and Patient connected to face mask  Post-op Assessment: Report given to RN and Post -op Vital signs reviewed and stable  Post vital signs: Reviewed and stable  Last Vitals:  Vitals Value Taken Time  BP 135/81 08/21/20 1400  Temp    Pulse 72 08/21/20 1402  Resp 16 08/21/20 1402  SpO2 99 % 08/21/20 1402  Vitals shown include unvalidated device data.  Last Pain:  Vitals:   08/21/20 0847  TempSrc: Oral  PainSc:          Complications: No complications documented.

## 2020-08-21 NOTE — Anesthesia Postprocedure Evaluation (Signed)
Anesthesia Post Note  Patient: Shane Fowler  Procedure(s) Performed: XI ROBOTIC ASSISTED SIMPLE PROSTATECTOMY (N/A )     Patient location during evaluation: PACU Anesthesia Type: General Level of consciousness: awake and alert Pain management: pain level controlled Vital Signs Assessment: post-procedure vital signs reviewed and stable Respiratory status: spontaneous breathing, nonlabored ventilation and respiratory function stable Cardiovascular status: blood pressure returned to baseline and stable Postop Assessment: no apparent nausea or vomiting Anesthetic complications: no   No complications documented.  Last Vitals:  Vitals:   08/21/20 1500 08/21/20 1515  BP: (!) 154/87 (!) 157/86  Pulse: 68 70  Resp:    Temp:    SpO2: 99% 100%    Last Pain:  Vitals:   08/21/20 1515  TempSrc:   PainSc: 3                  Lowella Curb

## 2020-08-21 NOTE — Discharge Instructions (Signed)

## 2020-08-22 ENCOUNTER — Encounter (HOSPITAL_COMMUNITY): Payer: Self-pay | Admitting: Urology

## 2020-08-22 DIAGNOSIS — N401 Enlarged prostate with lower urinary tract symptoms: Secondary | ICD-10-CM | POA: Diagnosis not present

## 2020-08-22 DIAGNOSIS — N400 Enlarged prostate without lower urinary tract symptoms: Secondary | ICD-10-CM | POA: Diagnosis present

## 2020-08-22 DIAGNOSIS — Z87891 Personal history of nicotine dependence: Secondary | ICD-10-CM | POA: Diagnosis not present

## 2020-08-22 DIAGNOSIS — Z905 Acquired absence of kidney: Secondary | ICD-10-CM | POA: Diagnosis not present

## 2020-08-22 DIAGNOSIS — N390 Urinary tract infection, site not specified: Secondary | ICD-10-CM | POA: Diagnosis not present

## 2020-08-22 DIAGNOSIS — I2510 Atherosclerotic heart disease of native coronary artery without angina pectoris: Secondary | ICD-10-CM | POA: Diagnosis not present

## 2020-08-22 LAB — BASIC METABOLIC PANEL
Anion gap: 10 (ref 5–15)
BUN: 15 mg/dL (ref 8–23)
CO2: 26 mmol/L (ref 22–32)
Calcium: 8.8 mg/dL — ABNORMAL LOW (ref 8.9–10.3)
Chloride: 102 mmol/L (ref 98–111)
Creatinine, Ser: 0.94 mg/dL (ref 0.61–1.24)
GFR, Estimated: >60 mL/min (ref >60)
Glucose, Bld: 161 mg/dL — ABNORMAL HIGH (ref 70–99)
Potassium: 4.2 mmol/L (ref 3.5–5.1)
Sodium: 138 mmol/L (ref 135–145)

## 2020-08-22 LAB — GLUCOSE, CAPILLARY
Glucose-Capillary: 143 mg/dL — ABNORMAL HIGH (ref 70–99)
Glucose-Capillary: 131 mg/dL — ABNORMAL HIGH (ref 70–99)

## 2020-08-22 LAB — HEMOGLOBIN AND HEMATOCRIT, BLOOD
HCT: 36.4 % — ABNORMAL LOW (ref 39.0–52.0)
Hemoglobin: 12.2 g/dL — ABNORMAL LOW (ref 13.0–17.0)

## 2020-08-22 MED ORDER — CHLORHEXIDINE GLUCONATE CLOTH 2 % EX PADS
6.0000 | MEDICATED_PAD | Freq: Every day | CUTANEOUS | Status: DC
  Administered 2020-08-22: 6 via TOPICAL

## 2020-08-22 NOTE — Progress Notes (Signed)
Pt to be discharged to home this afternoon. Pt given discharge instructions including Foley and Leg bag care. Pt verbalized understanding and demonstrated ability to change from foley to leg bag. Pt also given Prostatectomy booklet. All Medications on discharge reviewed with the Pt and schedules for these Medications. Pt verbalized understanding of all discharge teaching. AVS with Pt at time of discharge

## 2020-08-22 NOTE — Care Management CC44 (Signed)
Condition Code 44 Documentation Completed  Patient Details  Name: Shane Fowler MRN: [id number] Date of Birth: [birth year]   Condition Code 44 given:  Yes Patient signature on Condition Code 44 notice:  Yes Documentation of 2 MD's agreement:  Yes Code 44 added to claim:  Yes    Armanda Heritage, RN 08/22/2020, 12:46 PM

## 2020-08-22 NOTE — Care Management Obs Status (Signed)
MEDICARE OBSERVATION STATUS NOTIFICATION   Patient Details  Name: Shane Fowler MRN: [id number] Date of Birth: 02/10/1951   Medicare Observation Status Notification Given:  Yes    Armanda Heritage, RN 08/22/2020, 12:46 PM

## 2020-08-22 NOTE — Op Note (Signed)
NAME: Shane Fowler, Shane Fowler MEDICAL RECORD NO: [id number] ACCOUNT NO: [id number] DATE OF BIRTH: [birth year] FACILITY: [facility] LOCATION: [facility] PHYSICIAN: Sebastian Ache, MD  Operative Report   DATE OF PROCEDURE: 08/21/2020  PREOPERATIVE DIAGNOSIS:  Massive prostatic hypertrophy.  PROCEDURES:   1.  Cystoscopy. 2.  Robotic-assisted laparoscopic simple prostatectomy.  ESTIMATED BLOOD LOSS:  500 mL  COMPLICATIONS:  None.  SPECIMEN:  Large prostate adenoma for permanent pathology.  FINDINGS:  1.  Massive bilobar prostatic hypertrophy. 2.  Complete resolution of obstructing lobes of prostate following simple prostatectomy. 3.  Unremarkable urinary bladder.  DRAINS: 1.  Jackson-Pratt drain to bulb suction. 2.  Foley catheter to straight drain.  ASSISTANT:  Harrie Foreman, PA  INDICATIONS:  The patient is a very pleasant 70 year old man with long history of obstructive voiding symptoms and elevated PSA.  He has had prostate biopsy in the past, which was negative.  Despite medical therapy, he has had progressive bother from a  mix of obstructive and irritative symptoms.  His prostate is noted to be quite large at over 150 grams, 2 years ago, with of course progressive enlargement since that point.  Options were discussed including continued medical therapy versus outlet  procedure.  Given his very large prostate size at greater than 150 grams, simple prostatectomy being most definitive and he wished to proceed.  He has not had cystoscopy in quite some time and to maximally rule out other etiologies for obstruction, this  was clearly indicated as well.  Informed consent was obtained and placed in the medical record.  PROCEDURE IN DETAIL:  The patient being verified, procedure being cystoscopy with simple prostatectomy was confirmed.  Procedure timeout was performed.  Intravenous antibiotics administered.  General endotracheal anesthesia induced.  The patient was  placed into a low lithotomy  position.  Sterile field was created, prepping and draping the patient's penis, perineum, and proximal thigh using iodine and his infraxiphoid abdomen using chlorhexidine gluconate after clipper shaving and further fashioning  him to the operating table using 3-inch tape with foam padding across the supraxiphoid chest.  Arms were tucked to the side, were padded with gel rolls.  Next, cystourethroscopy was performed using 16-French flexible cystoscope.  Inspection of the  anterior and posterior urethra only revealed a very large bilobar prostatic hypertrophy.  Inspection of urinary bladder revealed some mild trabeculation.  No papillary lesions or calcifications noted.  Retroflexion revealed no additional findings and no  median lobe.  Cystoscope was then exchanged for a 16-French Foley catheter per urethra to straight drain.  Next, a high-flow, low-pressure pneumoperitoneum was obtained using a Veress technique in the supraumbilical midline, having passed the aspiration  drop test.  An 8 mm robotic camera port was then placed in same location.  Laparoscopic examination of the peritoneal cavity revealed no significant adhesions and no visceral injuries.  The ports were placed as follows:  Right paramedian 8 mm robotic  port, right far lateral 12 mm AirSeal assist port, right paramedian 5 mm suction port, left paramedian 8 mm robotic port, left far lateral 8 mm robotic port.  Robot was docked and passed the electronic checks.  Initial attention was directed to the  development of space of Retzius.  Incision was made lateral to the right medial umbilical ligament from the midline towards the area of the internal ring, coursing along the iliac vessels towards the area of the right ureter, which was positively  identified.  The right vas deferens was encountered and purposely ligated and used  a medial bucket handle and the left bladder wall was swept away from the pelvic sidewall towards the area of the prostate.   A mirror image dissection was performed on the  left side. Anterior attachment was taken down with cautery scissors.  The bladder was quite large in size, consistent with known long-term obstruction.  The area of the bladder neck, prostate junction was defatted to better denote this demarcation.  This  fat was set aside for discard.  The endopelvic fascia was carefully swept away from the anterior apical portion of the prostate just enough so that a green load vascular stapler could be placed in the dorsal venous complex, which was used to control  this vascular structure, taking exquisite care to avoid membranous urethral injury, which did not occur.  Next, the bladder neck area was identified by moving the Foley catheter back and forth and a semilunar inverted U cystotomy was made at  approximately 1.5 cm proximal to the true bladder neck and the anterior 60% circumference of the bladder was dropped away.  This exposed the very large prostatic lobes as anticipated.  Next, 4 stay sutures of 0 Vicryl were placed, two onto each lobe of  the prostate to allow manipulation of the adenoma.  The ureteral orifices were clearly visualized and away from the planned plane of dissection.  Adenoma dissection was then performed first posteriorly, entering the adenoma plane in the midline, carrying  it towards the area of the apex of the prostate as denoted by anterior curvature of the prostate.  This was then carried laterally and then slightly anteriorly, this showed the posterior 50% circumference had been released to the adenoma from the  posterior plane.  These were then sequentially carried up towards the anterior portion, first on the right side, then the left side all the way to the apical area of the prostate.  Final apical dissection was performed by incising along the adenoma at 12  o'clock position along the Foley catheter all the way towards the true apex and final apical release was performed via this  visualization.  This completely freed up the very large adenoma.  Specimen was placed into an EndoCatch bag for later retrieval.   Digital rectal exam was then performed using indicator glove, and no evidence of rectal violation was noted.  Additional hemostasis was achieved with point coagulation current of the prostatic fossa, several small bleeding vessels.  This then resulted  in excellent hemostasis.  Posterior reconstruction and mucosal advancement was performed using double-armed 3-0 V-Loc suture, reapproximating the posterior 50% circumference of the urethra to the posterior bladder neck and mucosa, this creating a mucosal  bridge in the urethra across the prostatic fossa to the bladder proper.  Following this, ureteral orifices were again inspected and found to be visibly patent with copious urine.  Next, the small cystotomy was closed using two separate running suture  lines of 2-0 V-Loc, meeting in the midline and a 24-French 3-way Foley catheter was then placed per urethra, irrigation port plugged, irrigated quantitatively.  There were no clots.  This was connected to straight drain on mild traction.  A closed  suction drain was then brought out through the previous left lateral most robotic port site into the area of the peritoneal cavity.  The robot was then undocked.  The previous right lateral most assistant port site was closed at the fascia using  Carter-Thomason suture passer and 0 Vicryl.  Specimen was retrieved by extending the previous camera  port site superiorly for a distance of approximately 4 cm, removing the very large adenomatous specimen, setting aside for permanent pathology.  Extraction site was then closed at the fascia using figure-of-eight PDS x 3 followed by reapproximation of Scarpa's with running Vicryl.  All incision sites were infiltrated with dilute lipolyzed Marcaine and closed at the level of the skin using  subcuticular Monocryl followed by Dermabond.  The procedure  was then terminated.  The patient tolerated the procedure well with no immediate perioperative complications.  The patient was taken to postanesthesia care unit in stable condition.  Plan for  inpatient admission.  Please note, first assistant, Harrie Foreman was crucial for all portions of the surgery today.  She provided invaluable retraction, suctioning, specimen manipulation, vascular stapling and general first assistance.   SHW D: 08/21/2020 1:49:08 pm T: 08/22/2020 4:16:00 am  JOB: [id number]/ [id number]

## 2020-08-22 NOTE — Discharge Summary (Signed)
Physician Discharge Summary  Patient ID: Shane Fowler MRN: [id number] DOB/AGE: 68-Aug-1952 68 y.o.  Admit date: 08/21/2020 Discharge date: 08/22/2020  Admission Diagnoses:  Discharge Diagnoses:  Active Problems:   BPH with obstruction/lower urinary tract symptoms   Discharged Condition: good  Hospital Course: 70 year old male with long history of obstructive lower urinary tract symptoms underwent robotic assisted laparoscopic simple prostatectomy.  He tolerated the procedure well was stable postoperatively.  The following day, JP was removed and he was stable for discharge with Foley catheter.  Consults: None  Significant Diagnostic Studies: None  Treatments: surgery: As above  Discharge Exam: Blood pressure 108/64, pulse 72, temperature 98.1 F (36.7 C), temperature source Oral, resp. rate 16, height 5' 10"  (1.778 m), weight 98 kg, SpO2 97 %. General appearance: alert no acute distress Adequate perfusion of extremities Nonlabored respiration Abdomen soft, appropriately tender, nondistended, incisions clean dry and intact.  JP with minimal serosanguineous fluid Foley catheter in place.  In port was plugged.  He was draining amber urine  Disposition: Discharge disposition: 01-Home or Self Care       Discharge Instructions    No wound care   Complete by: As directed      Allergies as of 08/22/2020   No Known Allergies     Medication List    STOP taking these medications   aspirin EC 81 MG tablet   Fish Oil 1000 MG Caps     TAKE these medications   Accu-Chek Nano SmartView w/Device Kit Check blood sugars once daily   Accu-Chek Softclix Lancets lancets Check blood sugars once daily   atorvastatin 80 MG tablet Commonly known as: LIPITOR Take 80 mg by mouth daily.   carboxymethylcellulose 0.5 % Soln Commonly known as: REFRESH PLUS Place 1 drop into both eyes 3 (three) times daily as needed (dry eyes).   diclofenac Sodium 1 % Gel Commonly known  as: Voltaren Apply small amount along right chest near sternum where painful up to three times a day as needed   docusate sodium 100 MG capsule Commonly known as: COLACE Take 1 capsule (100 mg total) by mouth 2 (two) times daily.   finasteride 5 MG tablet Commonly known as: Proscar Take 1 tablet (5 mg total) by mouth daily.   fluticasone 50 MCG/ACT nasal spray Commonly known as: FLONASE Place 2 sprays into both nostrils daily.   glucose blood test strip Check blood sugars once daily   HYDROcodone-acetaminophen 5-325 MG tablet Commonly known as: Norco Take 1-2 tablets by mouth every 6 (six) hours as needed for moderate pain.   lisinopril 10 MG tablet Commonly known as: ZESTRIL Take 15 mg by mouth daily.   metoprolol tartrate 100 MG tablet Commonly known as: LOPRESSOR Take 2 hours prior to CT   sulfamethoxazole-trimethoprim 800-160 MG tablet Commonly known as: BACTRIM DS Take 1 tablet by mouth 2 (two) times daily. Start the day prior to foley removal appointment   Systane Balance 0.6 % Soln Generic drug: Propylene Glycol Place 1 drop into both eyes 2 (two) times daily as needed (dry eyes).       Follow-up Information    Alexis Frock, MD On 08/31/2020.   Specialty: Urology Why: at 11:15 for MD visit and office catheter removal Contact information: [place] [address] [phone]               Signed: Marton Redwood, III 08/22/2020, 11:20 AM

## 2020-08-24 LAB — SURGICAL PATHOLOGY

## 2020-08-25 ENCOUNTER — Encounter: Payer: Self-pay | Admitting: Internal Medicine

## 2020-08-26 MED ORDER — LISINOPRIL 10 MG PO TABS: 15.0000 mg | ORAL_TABLET | Freq: Every day | ORAL | 1 refills | Status: DC

## 2020-09-21 DIAGNOSIS — M79671 Pain in right foot: Secondary | ICD-10-CM | POA: Diagnosis not present

## 2020-09-21 DIAGNOSIS — M79672 Pain in left foot: Secondary | ICD-10-CM | POA: Diagnosis not present

## 2020-09-21 DIAGNOSIS — B351 Tinea unguium: Secondary | ICD-10-CM | POA: Diagnosis not present

## 2020-10-08 ENCOUNTER — Encounter: Payer: Self-pay | Admitting: Internal Medicine

## 2020-10-08 ENCOUNTER — Ambulatory Visit (INDEPENDENT_AMBULATORY_CARE_PROVIDER_SITE_OTHER): Payer: Medicare Other | Admitting: Internal Medicine

## 2020-10-08 ENCOUNTER — Other Ambulatory Visit: Payer: Self-pay

## 2020-10-08 VITALS — BP 116/78 | HR 78 | Temp 97.8°F | Resp 18 | Ht 70.0 in | Wt 192.1 lb

## 2020-10-08 DIAGNOSIS — E782 Mixed hyperlipidemia: Secondary | ICD-10-CM

## 2020-10-08 DIAGNOSIS — R1310 Dysphagia, unspecified: Secondary | ICD-10-CM

## 2020-10-08 DIAGNOSIS — Z7185 Encounter for immunization safety counseling: Secondary | ICD-10-CM

## 2020-10-08 LAB — LIPID PANEL
Cholesterol: 209 mg/dL — ABNORMAL HIGH (ref 0–200)
HDL: 32.1 mg/dL — ABNORMAL LOW (ref >39.00)
NonHDL: 177.04
Total CHOL/HDL Ratio: 7
Triglycerides: 301 mg/dL — ABNORMAL HIGH (ref 0.0–149.0)
VLDL: 60.2 mg/dL — ABNORMAL HIGH (ref 0.0–40.0)

## 2020-10-08 LAB — LDL CHOLESTEROL, DIRECT: Direct LDL: 117 mg/dL

## 2020-10-08 MED ORDER — PANTOPRAZOLE SODIUM 40 MG PO TBEC: 40.0000 mg | DELAYED_RELEASE_TABLET | Freq: Every day | ORAL | 2 refills | Status: DC

## 2020-10-08 NOTE — Assessment & Plan Note (Signed)
HTN: Self stopped lisinopril 2 weeks ago due to cough, BP today remains very good, encouraged to check BPs. Hyperlipidemia: On Lipitor, check FLP DOE/chest pain. Saw cardiology with atypical chest pain on 06-2020, coronary calcium score was 265, 92 percentile. Dysphagia: Today he reports occasional dysphagia and request a GI referral, I think that is reasonable, send Rx for PPIs and referred to GI. BPH: Status post simple prostatectomy, reports he is doing okay. Preventive care: As[facility] for a [facility]) He inquired about the COVID-vaccine, I encouraged him to proceed with his first dose ASAP.  He can do it at our facility today. RTC 6 months
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
## 2020-10-08 NOTE — Patient Instructions (Signed)
Proceed with your COVID vaccination downstairs  Check your blood pressure twice a month. BP GOAL is between 110/65 and  135/85. If it is consistently higher or lower, let me know    GO TO THE LAB : Get the blood work     GO TO THE FRONT DESK, PLEASE SCHEDULE YOUR APPOINTMENTS Come back for   a checkup in 6 months

## 2020-10-12 MED ORDER — ATORVASTATIN CALCIUM 80 MG PO TABS
80.0000 mg | ORAL_TABLET | Freq: Every day | ORAL | 3 refills | Status: DC
Start: 2020-10-12 — End: 2021-02-15

## 2020-10-12 NOTE — Addendum Note (Signed)
Addended byConrad Mission D on: 10/12/2020 07:47 AM   Modules accepted: Orders

## 2020-10-13 ENCOUNTER — Encounter: Payer: Self-pay | Admitting: Gastroenterology

## 2020-10-23 ENCOUNTER — Telehealth: Payer: Self-pay | Admitting: Internal Medicine

## 2020-10-23 NOTE — Telephone Encounter (Signed)
Copied from CRM [id number]. Topic: Medicare AWV >> Oct 23, 2020 11:01 AM Harris-Coley, [place]on Gully wrote: Reason for CRM: Left message for patient to schedule Annual Wellness Visit.  Please schedule with Health Nurse Advisor Clare Gandy. at [facility].
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
## 2020-11-11 ENCOUNTER — Ambulatory Visit (INDEPENDENT_AMBULATORY_CARE_PROVIDER_SITE_OTHER): Payer: Medicare Other

## 2020-11-11 VITALS — Ht 70.0 in | Wt 192.0 lb

## 2020-11-11 DIAGNOSIS — Z0000 Encounter for general adult medical examination without abnormal findings: Secondary | ICD-10-CM

## 2020-11-11 NOTE — Patient Instructions (Signed)
[name] , Thank you for taking time to complete your Medicare Wellness Visit. I appreciate your ongoing commitment to your health goals. Please review the following plan we discussed and let me know if I can assist you in the future.   Screening recommendations/referrals: Colonoscopy: Due-Per our conversation, you would like to discuss with Dr. Drue Novel. Recommended yearly ophthalmology/optometry visit for glaucoma screening and checkup Recommended yearly dental visit for hygiene and checkup  Vaccinations: Influenza vaccine: Due 12/2020 Pneumococcal vaccine: Due-May obtain vaccine at our office or your local pharmacy Tdap vaccine: Up to date-Due-10/31/2023 Shingles vaccine: Completed vaccines   Covid-19: Declined  Advanced directives: Declined information today  Conditions/risks identified: See problem list  Next appointment: Follow up in one year for your annual wellness visit. 11/16/2021 @ 8:20  Preventive Care 68 Years and Older, Male Preventive care refers to lifestyle choices and visits with your health care provider that can promote health and wellness. What does preventive care include? A yearly physical exam. This is also called an annual well check. Dental exams once or twice a year. Routine eye exams. Ask your health care provider how often you should have your eyes checked. Personal lifestyle choices, including: Daily care of your teeth and gums. Regular physical activity. Eating a healthy diet. Avoiding tobacco and drug use. Limiting alcohol use. Practicing safe sex. Taking low doses of aspirin every day. Taking vitamin and mineral supplements as recommended by your health care provider. What happens during an annual well check? The services and screenings done by your health care provider during your annual well check will depend on your age, overall health, lifestyle risk factors, and family history of disease. Counseling  Your health care provider may ask you  questions about your: Alcohol use. Tobacco use. Drug use. Emotional well-being. Home and relationship well-being. Sexual activity. Eating habits. History of falls. Memory and ability to understand (cognition). Work and work Astronomer. Screening  You may have the following tests or measurements: Height, weight, and BMI. Blood pressure. Lipid and cholesterol levels. These may be checked every 5 years, or more frequently if you are over 62 years old. Skin check. Lung cancer screening. You may have this screening every year starting at age 40 if you have a 30-pack-year history of smoking and currently smoke or have quit within the past 15 years. Fecal occult blood test (FOBT) of the stool. You may have this test every year starting at age 7. Flexible sigmoidoscopy or colonoscopy. You may have a sigmoidoscopy every 5 years or a colonoscopy every 10 years starting at age 72. Prostate cancer screening. Recommendations will vary depending on your family history and other risks. Hepatitis C blood test. Hepatitis B blood test. Sexually transmitted disease (STD) testing. Diabetes screening. This is done by checking your blood sugar (glucose) after you have not eaten for a while (fasting). You may have this done every 1-3 years. Abdominal aortic aneurysm (AAA) screening. You may need this if you are a current or former smoker. Osteoporosis. You may be screened starting at age 44 if you are at high risk. Talk with your health care provider about your test results, treatment options, and if necessary, the need for more tests. Vaccines  Your health care provider may recommend certain vaccines, such as: Influenza vaccine. This is recommended every year. Tetanus, diphtheria, and acellular pertussis (Tdap, Td) vaccine. You may need a Td booster every 10 years. Zoster vaccine. You may need this after age 40. Pneumococcal 13-valent conjugate (PCV13) vaccine. One dose  is recommended after age  68. Pneumococcal polysaccharide (PPSV23) vaccine. One dose is recommended after age 7. Talk to your health care provider about which screenings and vaccines you need and how often you need them. This information is not intended to replace advice given to you by your health care provider. Make sure you discuss any questions you have with your health care provider. Document Released: 05/15/2015 Document Revised: 01/06/2016 Document Reviewed: 02/17/2015 Elsevier Interactive Patient Education  2017 [place] Prevention in the Home Falls can cause injuries. They can happen to people of all ages. There are many things you can do to make your home safe and to help prevent falls. What can I do on the outside of my home? Regularly fix the edges of walkways and driveways and fix any cracks. Remove anything that might make you trip as you walk through a door, such as a raised step or threshold. Trim any bushes or trees on the path to your home. Use bright outdoor lighting. Clear any walking paths of anything that might make someone trip, such as rocks or tools. Regularly check to see if handrails are loose or broken. Make sure that both sides of any steps have handrails. Any raised decks and porches should have guardrails on the edges. Have any leaves, snow, or ice cleared regularly. Use sand or salt on walking paths during winter. Clean up any spills in your garage right away. This includes oil or grease spills. What can I do in the bathroom? Use night lights. Install grab bars by the toilet and in the tub and shower. Do not use towel bars as grab bars. Use non-skid mats or decals in the tub or shower. If you need to sit down in the shower, use a plastic, non-slip stool. Keep the floor dry. Clean up any water that spills on the floor as soon as it happens. Remove soap buildup in the tub or shower regularly. Attach bath mats securely with double-sided non-slip rug tape. Do not have throw  rugs and other things on the floor that can make you trip. What can I do in the bedroom? Use night lights. Make sure that you have a light by your bed that is easy to reach. Do not use any sheets or blankets that are too big for your bed. They should not hang down onto the floor. Have a firm chair that has side arms. You can use this for support while you get dressed. Do not have throw rugs and other things on the floor that can make you trip. What can I do in the kitchen? Clean up any spills right away. Avoid walking on wet floors. Keep items that you use a lot in easy-to-reach places. If you need to reach something above you, use a strong step stool that has a grab bar. Keep electrical cords out of the way. Do not use floor polish or wax that makes floors slippery. If you must use wax, use non-skid floor wax. Do not have throw rugs and other things on the floor that can make you trip. What can I do with my stairs? Do not leave any items on the stairs. Make sure that there are handrails on both sides of the stairs and use them. Fix handrails that are broken or loose. Make sure that handrails are as long as the stairways. Check any carpeting to make sure that it is firmly attached to the stairs. Fix any carpet that is loose or worn. Avoid  having throw rugs at the top or bottom of the stairs. If you do have throw rugs, attach them to the floor with carpet tape. Make sure that you have a light switch at the top of the stairs and the bottom of the stairs. If you do not have them, ask someone to add them for you. What else can I do to help prevent falls? Wear shoes that: Do not have high heels. Have rubber bottoms. Are comfortable and fit you well. Are closed at the toe. Do not wear sandals. If you use a stepladder: Make sure that it is fully opened. Do not climb a closed stepladder. Make sure that both sides of the stepladder are locked into place. Ask someone to hold it for you, if  possible. Clearly mark and make sure that you can see: Any grab bars or handrails. First and last steps. Where the edge of each step is. Use tools that help you move around (mobility aids) if they are needed. These include: Canes. Walkers. Scooters. Crutches. Turn on the lights when you go into a dark area. Replace any light bulbs as soon as they burn out. Set up your furniture so you have a clear path. Avoid moving your furniture around. If any of your floors are uneven, fix them. If there are any pets around you, be aware of where they are. Review your medicines with your doctor. Some medicines can make you feel dizzy. This can increase your chance of falling. Ask your doctor what other things that you can do to help prevent falls. This information is not intended to replace advice given to you by your health care provider. Make sure you discuss any questions you have with your health care provider. Document Released: 02/12/2009 Document Revised: 09/24/2015 Document Reviewed: 05/23/2014 Elsevier Interactive Patient Education  2017 [employer].

## 2020-11-19 ENCOUNTER — Ambulatory Visit: Payer: Medicare Other | Admitting: Gastroenterology

## 2020-11-25 ENCOUNTER — Ambulatory Visit (INDEPENDENT_AMBULATORY_CARE_PROVIDER_SITE_OTHER): Payer: Medicare Other | Admitting: Gastroenterology

## 2020-11-25 ENCOUNTER — Other Ambulatory Visit: Payer: Self-pay

## 2020-11-25 ENCOUNTER — Encounter: Payer: Self-pay | Admitting: Gastroenterology

## 2020-11-25 VITALS — BP 118/72 | HR 62 | Ht 70.0 in | Wt 196.1 lb

## 2020-11-25 DIAGNOSIS — R1310 Dysphagia, unspecified: Secondary | ICD-10-CM

## 2020-11-25 DIAGNOSIS — R1013 Epigastric pain: Secondary | ICD-10-CM

## 2020-11-25 DIAGNOSIS — Z86010 Personal history of colonic polyps: Secondary | ICD-10-CM | POA: Diagnosis not present

## 2020-11-25 NOTE — Patient Instructions (Signed)
If you are age 68 or older, your body mass index should be between 23-30. Your Body mass index is 28.14 kg/m. If this is out of the aforementioned range listed, please consider follow up with your Primary Care Provider.  If you are age 23 or younger, your body mass index should be between 19-25. Your Body mass index is 28.14 kg/m. If this is out of the aformentioned range listed, please consider follow up with your Primary Care Provider.   Due to recent changes in healthcare laws, you may see the results of your imaging and laboratory studies on MyChart before your provider has had a chance to review them.  We understand that in some cases there may be results that are confusing or concerning to you. Not all laboratory results come back in the same time frame and the provider may be waiting for multiple results in order to interpret others.  Please give [place] 48 hours in order for your provider to thoroughly review all the results before contacting the office for clarification of your results.   ,Thank you for choosing me and [facility].  Vito Cirigliano, D.O.
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
## 2020-11-30 DIAGNOSIS — N401 Enlarged prostate with lower urinary tract symptoms: Secondary | ICD-10-CM | POA: Diagnosis not present

## 2020-11-30 DIAGNOSIS — R3915 Urgency of urination: Secondary | ICD-10-CM | POA: Diagnosis not present

## 2020-12-21 ENCOUNTER — Encounter: Payer: Self-pay | Admitting: Gastroenterology

## 2020-12-21 ENCOUNTER — Other Ambulatory Visit: Payer: Self-pay

## 2020-12-21 ENCOUNTER — Ambulatory Visit (AMBULATORY_SURGERY_CENTER): Payer: Medicare Other | Admitting: Gastroenterology

## 2020-12-21 VITALS — BP 121/81 | HR 60 | Temp 98.0°F | Resp 16 | Ht 70.0 in | Wt 196.0 lb

## 2020-12-21 DIAGNOSIS — R1319 Other dysphagia: Secondary | ICD-10-CM | POA: Diagnosis not present

## 2020-12-21 DIAGNOSIS — R1013 Epigastric pain: Secondary | ICD-10-CM | POA: Diagnosis not present

## 2020-12-21 DIAGNOSIS — K317 Polyp of stomach and duodenum: Secondary | ICD-10-CM

## 2020-12-21 MED ORDER — SODIUM CHLORIDE 0.9 % IV SOLN: 500.0000 mL | Freq: Once | INTRAVENOUS | Status: DC

## 2020-12-21 NOTE — Op Note (Signed)
[facility] Patient Name: Shane Fowler Procedure Date: 12/21/2020 8:07 AM MRN: [id number] Endoscopist: Doristine Locks , MD Age: 70 Referring MD:  Date of Birth: May 24, 1950 Gender: Male Account #: [id number] Procedure:                Upper GI endoscopy Indications:              Epigastric abdominal pain, Dysphagia, Odynophagia Medicines:                Monitored Anesthesia Care Procedure:                Pre-Anesthesia Assessment:                           - Prior to the procedure, a History and Physical                            was performed, and patient medications and                            allergies were reviewed. The patient's tolerance of                            previous anesthesia was also reviewed. The risks                            and benefits of the procedure and the sedation                            options and risks were discussed with the patient.                            All questions were answered, and informed consent                            was obtained. Prior Anticoagulants: The patient has                            taken no previous anticoagulant or antiplatelet                            agents. ASA Grade Assessment: III - A patient with                            severe systemic disease. After reviewing the risks                            and benefits, the patient was deemed in                            satisfactory condition to undergo the procedure.                           After obtaining informed consent, the endoscope was  passed under direct vision. Throughout the                            procedure, the patient's blood pressure, pulse, and                            oxygen saturations were monitored continuously. The                            GIF [id number] #[id number] was introduced through the                            mouth, and advanced to the second part of duodenum.                             The upper GI endoscopy was technically difficult                            and complex due to presence of food. The patient                            tolerated the procedure well. Scope In: Scope Out: Findings:                 The examined esophagus was normal. Empiric dilation                            not performed due to retained gastric food.                           The Z-line was regular and was found 40 cm from the                            incisors.                           A large amount of food (residue) was found in the                            gastric fundus, in the gastric body and in the                            gastric antrum which precluded visualization and                            lead to an abbreviated study.                           A single 15 mm pedunculated polyp was found in the                            gastric body. This was not resected in favor of  resection when the stomach was clear and could                            allow for appropriate procedural time/conditions                            for resection.                           The examined duodenum was normal. Complications:            No immediate complications. Estimated Blood Loss:     Estimated blood loss: none. Impression:               - Normal esophagus.                           - Z-line regular, 40 cm from the incisors.                           - A large amount of food (residue) in the stomach.                           - A single gastric polyp.                           - Normal examined duodenum.                           - No specimens collected. Recommendation:           - Patient has a contact number available for                            emergencies. The signs and symptoms of potential                            delayed complications were discussed with the                            patient. Return to normal activities tomorrow.                             Written discharge instructions were provided to the                            patient.                           - Resume previous diet.                           - Continue present medications.                           - Do a gastric emptying study at appointment to be  scheduled.                           - Plan for repeat upper endoscopy with polypectomy                            and possible esophageal dilation, to be performed                            after completion of GES. Doristine Locks, MD 12/21/2020 8:24:47 AM

## 2020-12-21 NOTE — Patient Instructions (Signed)
The office will contact you to schedule a gastric emptying study.  A future EGD will be scheduled following that study.    YOU HAD AN ENDOSCOPIC PROCEDURE TODAY AT THE [facility]:   Refer to the procedure report that was given to you for any specific questions about what was found during the examination.  If the procedure report does not answer your questions, please call your gastroenterologist to clarify.  If you requested that your care partner not be given the details of your procedure findings, then the procedure report has been included in a sealed envelope for you to review at your convenience later.  YOU SHOULD EXPECT: Some feelings of bloating in the abdomen. Passage of more gas than usual.  Walking can help get rid of the air that was put into your GI tract during the procedure and reduce the bloating. If you had a lower endoscopy (such as a colonoscopy or flexible sigmoidoscopy) you may notice spotting of blood in your stool or on the toilet paper. If you underwent a bowel prep for your procedure, you may not have a normal bowel movement for a few days.  Please Note:  You might notice some irritation and congestion in your nose or some drainage.  This is from the oxygen used during your procedure.  There is no need for concern and it should clear up in a day or s  SYMPTOMS TO REPORT IMMEDIATELY:  Following upper endoscopy (EGD)  Vomiting of blood or coffee ground material  New chest pain or pain under the shoulder blades  Painful or persistently difficult swallowing  New shortness of breath  Fever of 100F or higher  Black, tarry-looking stools  For urgent or emergent issues, a gastroenterologist can be reached at any hour by calling [phone]. Do not use MyChart messaging for urgent concerns.    DIET:  We do recommend a small meal at first, but then you may proceed to your regular diet.  Drink plenty of fluids but you should avoid alcoholic beverages for 24  hours.  ACTIVITY:  You should plan to take it easy for the rest of today and you should NOT DRIVE or use heavy machinery until tomorrow (because of the sedation medicines used during the test).    FOLLOW UP: Our staff will call the number listed on your records 48-72 hours following your procedure to check on you and address any questions or concerns that you may have regarding the information given to you following your procedure. If we do not reach you, we will leave a message.  We will attempt to reach you two times.  During this call, we will ask if you have developed any symptoms of COVID 19. If you develop any symptoms (ie: fever, flu-like symptoms, shortness of breath, cough etc.) before then, please call [phone].  If you test positive for Covid 19 in the 2 weeks post procedure, please call and report this information to [place].    If any biopsies were taken you will be contacted by phone or by letter within the next 1-3 weeks.  Please call us at [phone] if you have not heard about the biopsies in 3 weeks.    SIGNATURES/CONFIDENTIALITY: You and/or your care partner have signed paperwork which will be entered into your electronic medical record.  These signatures attest to the fact that that the information above on your After Visit Summary has been reviewed and is understood.  Full responsibility of the confidentiality of  this discharge information lies with you and/or your care-partner.
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
## 2020-12-21 NOTE — Progress Notes (Signed)
To PACU, VSS. Report to [email or web address] 

## 2020-12-21 NOTE — Progress Notes (Signed)
Check-in-sb  V/s-sb  During admitting process pain assessment pt. C/o chest tightness -level of 4 and stated "It started today". 12 lead ecg performed and placed on chart. Dr. Barron Alvine notified.[id number] crna in to evaluate pt. He denies shortness of breath and pain anywhere else,pt. Informed us that he was seen by cardiologist and they told him that the pain is not coming from his heart.pt.  informed us that he has  experienced this chest pain before.34 dr. Bethann Berkshire into evaluate pt. For procedure and given ECG,order to proceed with procedure.

## 2020-12-23 ENCOUNTER — Telehealth: Payer: Self-pay | Admitting: *Deleted

## 2020-12-23 NOTE — Telephone Encounter (Signed)
  Follow up Call-  Call back number 12/21/2020  Post procedure Call Back phone  # [phone]  Permission to leave phone message Yes  Some recent data might be hidden     Patient questions:  Do you have a fever, pain , or abdominal swelling? No. Pain Score  0 *  Have you tolerated food without any problems? Yes.    Have you been able to return to your normal activities? Yes.    Do you have any questions about your discharge instructions: Diet   No. Medications  No. Follow up visit  No.  Do you have questions or concerns about your Care? No.  Actions: * If pain score is 4 or above: No action needed, pain <4.  Have you developed a fever since your procedure? no  2.   Have you had an respiratory symptoms (SOB or cough) since your procedure? no  3.   Have you tested positive for COVID 19 since your procedure no  4.   Have you had any family members/close contacts diagnosed with the COVID 19 since your procedure?  no   If yes to any of these questions please route to Laverna Peace, RN and Karlton Lemon, RN

## 2020-12-24 ENCOUNTER — Telehealth: Payer: Self-pay

## 2020-12-24 DIAGNOSIS — R1310 Dysphagia, unspecified: Secondary | ICD-10-CM

## 2020-12-24 DIAGNOSIS — R1013 Epigastric pain: Secondary | ICD-10-CM

## 2020-12-24 NOTE — Telephone Encounter (Signed)
Per 12/21/20 procedure report - Do a gastric emptying study at appt to be scheduled.   GES order is in epic. Secure staff message sent to radiology schedulers to contact pt for an appt. Spoke with patient and he is aware that radiology scheduling will contact him directly to set up his appt. Patient verbalized understanding and had no concerns at the end of the call.

## 2020-12-28 DIAGNOSIS — M79672 Pain in left foot: Secondary | ICD-10-CM | POA: Diagnosis not present

## 2020-12-28 DIAGNOSIS — B351 Tinea unguium: Secondary | ICD-10-CM | POA: Diagnosis not present

## 2020-12-28 DIAGNOSIS — M79671 Pain in right foot: Secondary | ICD-10-CM | POA: Diagnosis not present

## 2020-12-28 DIAGNOSIS — L600 Ingrowing nail: Secondary | ICD-10-CM | POA: Diagnosis not present

## 2021-01-08 ENCOUNTER — Ambulatory Visit (HOSPITAL_COMMUNITY)
Admission: RE | Admit: 2021-01-08 | Discharge: 2021-01-08 | Disposition: A | Discharge status: Home / Self Care | Payer: Medicare Other | Source: Ambulatory Visit | Attending: Gastroenterology | Admitting: Gastroenterology

## 2021-01-08 ENCOUNTER — Other Ambulatory Visit: Payer: Self-pay

## 2021-01-08 DIAGNOSIS — R1013 Epigastric pain: Secondary | ICD-10-CM | POA: Diagnosis not present

## 2021-01-08 DIAGNOSIS — R1310 Dysphagia, unspecified: Secondary | ICD-10-CM | POA: Insufficient documentation

## 2021-01-08 MED ORDER — TECHNETIUM TC 99M SULFUR COLLOID
2.2000 | Freq: Once | INTRAVENOUS | Status: AC
  Administered 2021-01-08: 2.2 via ORAL

## 2021-01-14 NOTE — Telephone Encounter (Signed)
Gastric emptying study was normal.  Okay to schedule for repeat upper endoscopy with esophageal dilation and resection of known gastric polyp.  This can be done in the [facility].  Recommend eating lightly the day prior to the procedure, and full liquid dinner the night prior to the procedure.

## 2021-01-14 NOTE — Telephone Encounter (Signed)
Lm on vm for patient to return call. He has been scheduled for an EGD with Dr. Barron Alvine on Friday, 01/22/21 at 10 am. Advised patient to confirm that this will work for him so that we can get instructions sent to him.

## 2021-01-15 ENCOUNTER — Telehealth: Payer: Self-pay | Admitting: Gastroenterology

## 2021-01-15 ENCOUNTER — Ambulatory Visit: Payer: Medicare Other | Admitting: Cardiology

## 2021-01-15 DIAGNOSIS — R1310 Dysphagia, unspecified: Secondary | ICD-10-CM

## 2021-01-15 DIAGNOSIS — K317 Polyp of stomach and duodenum: Secondary | ICD-10-CM

## 2021-01-15 NOTE — Telephone Encounter (Signed)
Inbound call from Shane Fowler requesting a call back to reschedule this EGD. I stated that I could help her reschedule but she insisted on talking with a nurse before rescheduling this procedure. Please advise. Thank you

## 2021-01-15 NOTE — Telephone Encounter (Signed)
Spoke with Elease Hashimoto, pt has been rescheduled to have EGD with Dr. Barron Alvine on Wednesday, 01/27/21 at 8:30 am. She is aware they will need to arrive on the 4th floor by 7:30 am. Elease Hashimoto states that they have access to my chart. Advised that I will send updated instructions there for their review. Elease Hashimoto verbalized understanding and had no concerns at the end of the call.  Ambulatory referral to GI in epic.

## 2021-01-22 ENCOUNTER — Encounter: Payer: Medicare Other | Admitting: Gastroenterology

## 2021-01-27 ENCOUNTER — Ambulatory Visit (AMBULATORY_SURGERY_CENTER): Payer: Medicare Other | Admitting: Gastroenterology

## 2021-01-27 ENCOUNTER — Other Ambulatory Visit: Payer: Self-pay

## 2021-01-27 ENCOUNTER — Other Ambulatory Visit: Payer: Self-pay | Admitting: Gastroenterology

## 2021-01-27 ENCOUNTER — Encounter: Payer: Self-pay | Admitting: Gastroenterology

## 2021-01-27 VITALS — BP 127/74 | HR 63 | Temp 96.4°F | Resp 14 | Ht 70.0 in | Wt 196.0 lb

## 2021-01-27 DIAGNOSIS — K2970 Gastritis, unspecified, without bleeding: Secondary | ICD-10-CM | POA: Diagnosis not present

## 2021-01-27 DIAGNOSIS — K2950 Unspecified chronic gastritis without bleeding: Secondary | ICD-10-CM | POA: Diagnosis not present

## 2021-01-27 DIAGNOSIS — K317 Polyp of stomach and duodenum: Secondary | ICD-10-CM

## 2021-01-27 DIAGNOSIS — K2990 Gastroduodenitis, unspecified, without bleeding: Secondary | ICD-10-CM

## 2021-01-27 DIAGNOSIS — R1013 Epigastric pain: Secondary | ICD-10-CM

## 2021-01-27 DIAGNOSIS — B9681 Helicobacter pylori [H. pylori] as the cause of diseases classified elsewhere: Secondary | ICD-10-CM | POA: Diagnosis not present

## 2021-01-27 DIAGNOSIS — R1310 Dysphagia, unspecified: Secondary | ICD-10-CM | POA: Diagnosis not present

## 2021-01-27 MED ORDER — SODIUM CHLORIDE 0.9 % IV SOLN: 500.0000 mL | Freq: Once | INTRAVENOUS | Status: DC

## 2021-01-27 NOTE — Progress Notes (Signed)
Pt Drowsy. VSS. To PACU, report to RN. No anesthetic complications noted. Bovie site clear 

## 2021-01-27 NOTE — Progress Notes (Signed)
No problems noted in the recovery room. maw 

## 2021-01-27 NOTE — Progress Notes (Signed)
VS completed by NS.  Medical history reviewed and updated.   

## 2021-01-27 NOTE — Op Note (Signed)
[facility] Patient Name: Onis Markoff Procedure Date: 01/27/2021 8:24 AM MRN: [id number] Endoscopist: Doristine Locks , MD Age: 70 Referring MD:  Date of Birth: 06/14/1950 Gender: Male Account #: [id number] Procedure:                Upper GI endoscopy Indications:              Epigastric abdominal pain, Dysphagia, For therapy                            of gastric polyp Medicines:                Monitored Anesthesia Care Procedure:                Pre-Anesthesia Assessment:                           - Prior to the procedure, a History and Physical                            was performed, and patient medications and                            allergies were reviewed. The patient's tolerance of                            previous anesthesia was also reviewed. The risks                            and benefits of the procedure and the sedation                            options and risks were discussed with the patient.                            All questions were answered, and informed consent                            was obtained. Prior Anticoagulants: The patient has                            taken no previous anticoagulant or antiplatelet                            agents. ASA Grade Assessment: III - A patient with                            severe systemic disease. After reviewing the risks                            and benefits, the patient was deemed in                            satisfactory condition to undergo the procedure.  After obtaining informed consent, the endoscope was                            passed under direct vision. Throughout the                            procedure, the patient's blood pressure, pulse, and                            oxygen saturations were monitored continuously. The                            GIF [id number] #[id number] was introduced through the                            mouth, and advanced to the second  part of duodenum.                            The upper GI endoscopy was accomplished without                            difficulty. The patient tolerated the procedure                            well. Scope In: Scope Out: Findings:                 The examined esophagus was normal. Given the                            presenting symptom of dysphagia, the decision was                            made to perform empiric esophageal dilation. The                            scope was withdrawn. Dilation was performed with a                            Maloney dilator with no resistance at 54 Fr. The                            dilation site was examined following endoscope                            reinsertion and showed no bleeding, mucosal tear or                            perforation. Biopsies were then obtained from the                            proximal and distal esophagus with cold forceps for  histology of suspected eosinophilic esophagitis.                            Estimated blood loss was minimal.                           The Z-line was regular and was found 40 cm from the                            incisors.                           A single 15 mm pedunculated polyp was found in the                            gastric body. The polyp was removed with a hot                            snare. Resection and retrieval were complete.                            Estimated blood loss: none.                           Mild inflammation characterized by congestion                            (edema) and erythema was found in the gastric                            fundus, in the gastric body and in the gastric                            antrum. Biopsies were taken with a cold forceps for                            Helicobacter pylori testing. Estimated blood loss                            was minimal.                           The examined duodenum was  normal. Complications:            No immediate complications. Estimated Blood Loss:     Estimated blood loss was minimal. Impression:               - Normal esophagus. Dilated with 54 Fr Maloney then                            biopsied.                           - Z-line regular, 40 cm from the incisors.                           -  A single gastric polyp. Resected and retrieved.                           - Gastritis. Biopsied.                           - Normal examined duodenum. Recommendation:           - Patient has a contact number available for                            emergencies. The signs and symptoms of potential                            delayed complications were discussed with the                            patient. Return to normal activities tomorrow.                            Written discharge instructions were provided to the                            patient.                           - Resume previous diet.                           - Continue present medications.                           - Await pathology results.                           - Repeat upper endoscopy PRN.                           - Return to GI clinic PRN. Doristine Locks, MD 01/27/2021 8:50:01 AM

## 2021-01-27 NOTE — Progress Notes (Signed)
Called to room to assist during endoscopic procedure.  Patient ID and intended procedure confirmed with present staff. Received instructions for my participation in the procedure from the performing physician.  
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
## 2021-01-27 NOTE — Patient Instructions (Addendum)
Handout was given to your care partner on Gastritis. You may resume your current medications today. Await biopsy results.  May take 1-3 weeks to receive pathology results. Please call if any questions or concerns.      YOU HAD AN ENDOSCOPIC PROCEDURE TODAY AT THE [facility]:   Refer to the procedure report that was given to you for any specific questions about what was found during the examination.  If the procedure report does not answer your questions, please call your gastroenterologist to clarify.  If you requested that your care partner not be given the details of your procedure findings, then the procedure report has been included in a sealed envelope for you to review at your convenience later.  YOU SHOULD EXPECT: Some feelings of bloating in the abdomen. Passage of more gas than usual.  Walking can help get rid of the air that was put into your GI tract during the procedure and reduce the bloating. If you had a lower endoscopy (such as a colonoscopy or flexible sigmoidoscopy) you may notice spotting of blood in your stool or on the toilet paper. If you underwent a bowel prep for your procedure, you may not have a normal bowel movement for a few days.  Please Note:  You might notice some irritation and congestion in your nose or some drainage.  This is from the oxygen used during your procedure.  There is no need for concern and it should clear up in a day or so.  SYMPTOMS TO REPORT IMMEDIATELY:   Following upper endoscopy (EGD)  Vomiting of blood or coffee ground material  New chest pain or pain under the shoulder blades  Painful or persistently difficult swallowing  New shortness of breath  Fever of 100F or higher  Black, tarry-looking stools  For urgent or emergent issues, a gastroenterologist can be reached at any hour by calling [phone]. Do not use MyChart messaging for urgent concerns.    DIET:  We do recommend a small meal at first, but then you may  proceed to your regular diet.  Drink plenty of fluids but you should avoid alcoholic beverages for 24 hours.  ACTIVITY:  You should plan to take it easy for the rest of today and you should NOT DRIVE or use heavy machinery until tomorrow (because of the sedation medicines used during the test).    FOLLOW UP: Our staff will call the number listed on your records 48-72 hours following your procedure to check on you and address any questions or concerns that you may have regarding the information given to you following your procedure. If we do not reach you, we will leave a message.  We will attempt to reach you two times.  During this call, we will ask if you have developed any symptoms of COVID 19. If you develop any symptoms (ie: fever, flu-like symptoms, shortness of breath, cough etc.) before then, please call [phone].  If you test positive for Covid 19 in the 2 weeks post procedure, please call and report this information to us.    If any biopsies were taken you will be contacted by phone or by letter within the next 1-3 weeks.  Please call us at [phone] if you have not heard about the biopsies in 3 weeks.    SIGNATURES/CONFIDENTIALITY: You and/or your care partner have signed paperwork which will be entered into your electronic medical record.  These signatures attest to the fact that that the information above on   your After Visit Summary has been reviewed and is understood.  Full responsibility of the confidentiality of this discharge information lies with you and/or your care-partner.  

## 2021-01-29 ENCOUNTER — Telehealth: Payer: Self-pay

## 2021-01-29 NOTE — Telephone Encounter (Signed)
  Follow up Call-  Call back number 01/27/2021 12/21/2020  Post procedure Call Back phone  # [phone]  Permission to leave phone message Yes Yes  Some recent data might be hidden     Patient questions:  Do you have a fever, pain , or abdominal swelling? No. Pain Score  0 *  Have you tolerated food without any problems? Yes.    Have you been able to return to your normal activities? Yes.    Do you have any questions about your discharge instructions: Diet   No. Medications  No. Follow up visit  No.  Do you have questions or concerns about your Care? No.  Actions: * If pain score is 4 or above: No action needed, pain <4.

## 2021-01-31 ENCOUNTER — Other Ambulatory Visit: Payer: Self-pay | Admitting: Internal Medicine

## 2021-02-01 ENCOUNTER — Telehealth: Payer: Self-pay | Admitting: General Surgery

## 2021-02-01 MED ORDER — DOXYCYCLINE HYCLATE 100 MG PO CAPS: 100.0000 mg | ORAL_CAPSULE | Freq: Two times a day (BID) | ORAL | 0 refills | Status: AC

## 2021-02-01 MED ORDER — BISMUTH SUBSALICYLATE 262 MG PO CHEW: 524.0000 mg | CHEWABLE_TABLET | Freq: Four times a day (QID) | ORAL | 0 refills | Status: AC

## 2021-02-01 MED ORDER — METRONIDAZOLE 250 MG PO TABS: 250.0000 mg | ORAL_TABLET | Freq: Four times a day (QID) | ORAL | 0 refills | Status: AC

## 2021-02-01 MED ORDER — OMEPRAZOLE 20 MG PO CPDR: 20.0000 mg | DELAYED_RELEASE_CAPSULE | Freq: Two times a day (BID) | ORAL | 0 refills | Status: DC

## 2021-02-01 NOTE — Telephone Encounter (Signed)
-----   Message from Vito Cirigliano V, DO sent at 02/01/2021  7:56 AM EDT ----- - The polyp removed from the stomach was a benign hyperplastic polyp.  There is no evidence of intestinal metaplasia, dysplasia, or other worrisome features. - The biopsies in the esophagus were benign. -The biopsies from the stomach were notable for H. Pylori gastritis, and will plan on treating with quad therapy as below. Please confirm no medication allergies to the prescribed regimen.   1) Omeprazole 20 mg 2 times a day x 14 d 2) Pepto Bismol 2 tabs (262 mg each) 4 times a day x 14 d 3) Metronidazole 250 mg 4 times a day x 14 d 4) doxycycline 100 mg 2 times a day x 14 d  After 14 days, ok to stop omeprazole.  4 weeks after treatment completed, check H. Pylori stool antigen to confirm eradication (must be off acid suppression therapy)  Dx: H. Pylori gastritis   

## 2021-02-01 NOTE — Telephone Encounter (Signed)
-----   Message from Shiloh V, DO sent at 02/01/2021  7:56 AM EDT ----- - The polyp removed from the stomach was a benign hyperplastic polyp.  There is no evidence of intestinal metaplasia, dysplasia, or other worrisome features. - The biopsies in the esophagus were benign. -The biopsies from the stomach were notable for H. Pylori gastritis, and will plan on treating with quad therapy as below. Please confirm no medication allergies to the prescribed regimen.   1) Omeprazole 20 mg 2 times a day x 14 d 2) Pepto Bismol 2 tabs (262 mg each) 4 times a day x 14 d 3) Metronidazole 250 mg 4 times a day x 14 d 4) doxycycline 100 mg 2 times a day x 14 d  After 14 days, ok to stop omeprazole.  4 weeks after treatment completed, check H. Pylori stool antigen to confirm eradication (must be off acid suppression therapy)  Dx: H. Pylori gastritis

## 2021-02-01 NOTE — Telephone Encounter (Signed)
Left a voicemail for the patient to contact the office to discuss bx report and advise him of medication and labwork he will need in the future.

## 2021-02-01 NOTE — Telephone Encounter (Signed)
Tried to contact the patient at home, no answer, no voicemail. Sent a mychart message regarding medications and h. pylori

## 2021-02-15 ENCOUNTER — Other Ambulatory Visit: Payer: Self-pay

## 2021-02-15 DIAGNOSIS — Z8719 Personal history of other diseases of the digestive system: Secondary | ICD-10-CM | POA: Insufficient documentation

## 2021-02-15 DIAGNOSIS — I2510 Atherosclerotic heart disease of native coronary artery without angina pectoris: Secondary | ICD-10-CM | POA: Insufficient documentation

## 2021-02-19 ENCOUNTER — Ambulatory Visit (INDEPENDENT_AMBULATORY_CARE_PROVIDER_SITE_OTHER): Payer: Medicare Other | Admitting: Cardiology

## 2021-02-19 ENCOUNTER — Other Ambulatory Visit: Payer: Self-pay

## 2021-02-19 ENCOUNTER — Encounter: Payer: Self-pay | Admitting: Cardiology

## 2021-02-19 VITALS — BP 142/68 | HR 71 | Ht 70.0 in | Wt 203.0 lb

## 2021-02-19 DIAGNOSIS — E782 Mixed hyperlipidemia: Secondary | ICD-10-CM

## 2021-02-19 DIAGNOSIS — N400 Enlarged prostate without lower urinary tract symptoms: Secondary | ICD-10-CM

## 2021-02-19 DIAGNOSIS — I25118 Atherosclerotic heart disease of native coronary artery with other forms of angina pectoris: Secondary | ICD-10-CM

## 2021-02-19 MED ORDER — ATORVASTATIN CALCIUM 10 MG PO TABS: 10.0000 mg | ORAL_TABLET | Freq: Every day | ORAL | 1 refills | Status: DC

## 2021-02-19 MED ORDER — ISOSORBIDE MONONITRATE ER 30 MG PO TB24: 30.0000 mg | ORAL_TABLET | Freq: Every day | ORAL | 1 refills | Status: DC
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
## 2021-02-19 NOTE — Addendum Note (Signed)
Addended by: Hazle Quant on: 02/19/2021 08:47 AM   Modules accepted: Orders

## 2021-02-19 NOTE — Patient Instructions (Signed)
Medication Instructions:  Your physician has recommended you make the following change in your medication:  START: Imdur 30 mg daily   START: Lipitor 10 mg daily   *If you need a refill on your cardiac medications before your next appointment, please call your pharmacy*   Lab Work: Your physician recommends that you return for lab work 6 weeks: lipid  If you have labs (blood work) drawn today and your tests are completely normal, you will receive your results only by: MyChart Message (if you have MyChart) OR A paper copy in the mail If you have any lab test that is abnormal or we need to change your treatment, we will call you to review the results.   Testing/Procedures: None   Follow-Up: At [facility], you and your health needs are our priority.  As part of our continuing mission to provide you with exceptional heart care, we have created designated Provider Care Teams.  These Care Teams include your primary Cardiologist (physician) and Advanced Practice Providers (APPs -  Physician Assistants and Nurse Practitioners) who all work together to provide you with the care you need, when you need it.  We recommend signing up for the patient portal called "MyChart".  Sign up information is provided on this After Visit Summary.  MyChart is used to connect with patients for Virtual Visits (Telemedicine).  Patients are able to view lab/test results, encounter notes, upcoming appointments, etc.  Non-urgent messages can be sent to your provider as well.   To learn more about what you can do with MyChart, go to [email or web address].    Your next appointment:   3 month(s)  The format for your next appointment:   In Person  Provider:   Gypsy Balsam, MD   Other Instructions  Atorvastatin Tablets What is this medication? ATORVASTATIN (a TORE va sta tin) treats high cholesterol and reduces the risk of heart attack and stroke. It works by decreasing bad cholesterol and fats (such  as LDL, triglycerides) and increasing good cholesterol (HDL) in your blood. It belongs to a group of medications called statins. Changes to diet and exercise are often combined with this medication. This medicine may be used for other purposes; ask your health care provider or pharmacist if you have questions. COMMON BRAND NAME(S): Lipitor What should I tell my care team before I take this medication? They need to know if you have any of these conditions: High blood sugar (diabetes) If you often drink alcohol Kidney disease Liver disease Muscle cramps, pain Stroke Thyroid disease An unusual or allergic reaction to atorvastatin, other medications, foods, dyes, or preservatives Pregnant or trying to get pregnant Breast-feeding How should I use this medication? Take this medication by mouth. Take it as directed on the label at the same time every day. You can take it with or without food. If it upsets your stomach, take it with food. Keep taking it unless your care team tells you to stop. Do not take this medication with grapefruit juice. Talk to your care team about the use of this medication in children. While it may be prescribed for children as young as 10 for selected conditions, precautions do apply. Overdosage: If you think you have taken too much of this medicine contact a poison control center or emergency room at once. NOTE: This medicine is only for you. Do not share this medicine with others. What if I miss a dose? If you miss a dose, take it as soon as you can. If it  is almost time for your next dose, take only that dose. Do not take double or extra doses. What may interact with this medication? Do not take this medication with any of the following: Dasabuvir; ombitasvir; paritaprevir; ritonavir Ombitasvir; paritaprevir; ritonavir Posaconazole Red yeast rice This medication may also interact with the following: Alcohol Birth control pills Certain antibiotics like  erythromycin and clarithromycin Certain antivirals for HIV or hepatitis Certain medications for cholesterol like fenofibrate, gemfibrozil, and niacin Certain medications for fungal infections like ketoconazole and itraconazole Colchicine Cyclosporine Digoxin Grapefruit juice Rifampin This list may not describe all possible interactions. Give your health care provider a list of all the medicines, herbs, non-prescription drugs, or dietary supplements you use. Also tell them if you smoke, drink alcohol, or use illegal drugs. Some items may interact with your medicine. What should I watch for while using this medication? Visit your health care provider for regular checks on your progress. Tell your health care provider if your symptoms do not start to get better or if they get worse. Your health care provider may tell you to stop taking this medication if you develop muscle problems. If your muscle problems do not go away after stopping this medication, contact your health care provider. Do not become pregnant while taking this medication. Women should inform their health care provider if they wish to become pregnant or think they might be pregnant. There is potential for serious harm to an unborn child. Talk to your health care provider for more information. Do not breast-feed an infant while taking this medication. This medication may increase blood sugar. Ask your health care provider if changes in diet or medications are needed if you have diabetes. If you are going to need surgery or other procedure, tell your health care provider that you are using this medication. Taking this medication is only part of a total heart healthy program. Your health care provider may give you a special diet to follow. Avoid alcohol. Avoid smoking. Ask your health care provider how much you should exercise. What side effects may I notice from receiving this medication? Side effects that you should report to your care  team as soon as possible: Allergic reactions-skin rash, itching, hives, swelling of the face, lips, tongue, or throat High blood sugar (hyperglycemia)-increased thirst or amount of urine, unusual weakness, fatigue, blurry vision Liver injury-right upper belly pain, loss of appetite, nausea, light-colored stool, dark yellow or brown urine, yellowing skin or eyes, unusual weakness, fatigue Muscle injury-unusual weakness, fatigue, muscle pain, dark yellow or brown urine, decrease in amount of urine Redness, blistering, peeling, or loosening of the skin, including inside the mouth Side effects that usually do not require medical attention (report to your care team if they continue or are bothersome): Diarrhea Nausea Trouble sleeping Upset stomach This list may not describe all possible side effects. Call your doctor for medical advice about side effects. You may report side effects to FDA at 1-800-FDA-1088. Where should I keep my medication? Keep out of the reach of children and pets. Store at room temperature between 20 and 25 degrees C (68 and 77 degrees F). Get rid of any unused medication after the expiration date. To get rid of medications that are no longer needed or have expired: Take the medication to a medication take-back program. Check with your pharmacy or law enforcement to find a location. If you cannot return the medication, check the label or package insert to see if the medication should be thrown out in  the garbage or flushed down the toilet. If you are not sure, ask your care team. If it is safe to put it in the trash, take the medication out of the container. Mix the medication with cat litter, dirt, coffee grounds, or other unwanted substance. Seal the mixture in a bag or container. Put it in the trash. NOTE: This sheet is a summary. It may not cover all possible information. If you have questions about this medicine, talk to your doctor, pharmacist, or health care provider.   2022 Elsevier/Gold Standard (2020-04-02 12:41:57)  Isosorbide Mononitrate Extended-Release Tablets What is this medication? ISOSORBIDE MONONITRATE (eye soe SOR bide mon oh NYE trate) prevents chest pain (angina). It works by relaxing blood vessels, which decreases the amount of work the heart has to do. It belongs to a group of medications called nitrates. Do not use it to treat sudden chest pain. This medicine may be used for other purposes; ask your health care provider or pharmacist if you have questions. COMMON BRAND NAME(S): Imdur, Isotrate ER What should I tell my care team before I take this medication? They need to know if you have any of these conditions: Previous heart attack or heart failure An unusual or allergic reaction to isosorbide mononitrate, nitrates, other medications, foods, dyes, or preservatives Pregnant or trying to get pregnant Breast-feeding How should I use this medication? Take this medication by mouth with a glass of water. Follow the directions on the prescription label. Do not crush or chew. Take your medication at regular intervals. Do not take your medication more often than directed. Do not stop taking this medication except on the advice of your care team. Talk to your care team about the use of this medication in children. Special care may be needed. Overdosage: If you think you have taken too much of this medicine contact a poison control center or emergency room at once. NOTE: This medicine is only for you. Do not share this medicine with others. What if I miss a dose? If you miss a dose, take it as soon as you can. If it is almost time for your next dose, take only that dose. Do not take double or extra doses. What may interact with this medication? Do not take this medication with any of the following: Medications used to treat erectile dysfunction (ED) like avanafil, sildenafil, tadalafil, and vardenafil Riociguat This medication may also interact with  the following: Medications for high blood pressure Other medications for angina or heart failure This list may not describe all possible interactions. Give your health care provider a list of all the medicines, herbs, non-prescription drugs, or dietary supplements you use. Also tell them if you smoke, drink alcohol, or use illegal drugs. Some items may interact with your medicine. What should I watch for while using this medication? Check your heart rate and blood pressure regularly while you are taking this medication. Ask your care team what your heart rate and blood pressure should be and when you should contact him or her. Tell your care team if you feel your medication is no longer working. You may get dizzy. Do not drive, use machinery, or do anything that needs mental alertness until you know how this medication affects you. To reduce the risk of dizzy or fainting spells, do not sit or stand up quickly, especially if you are an older patient. Alcohol can make you more dizzy, and increase flushing and rapid heartbeats. Avoid alcoholic drinks. Do not treat yourself for coughs,  colds, or pain while you are taking this medication without asking your care team for advice. Some ingredients may increase your blood pressure. What side effects may I notice from receiving this medication? Side effects that you should report to your care team as soon as possible: Allergic reactions-skin rash, itching, hives, swelling of the face, lips, tongue, or throat Headache, unusual weakness or fatigue, shortness of breath, nausea, vomiting, rapid heartbeat, blue skin or lips, which may be signs of methemoglobinemia Increased pressure around the brain-severe headache, blurry vision, change in vision, nausea, vomiting Low blood pressure-dizziness, feeling faint or lightheaded, blurry vision Slow heartbeat-dizziness, feeling faint or lightheaded, confusion, trouble breathing, unusual weakness or fatigue Worsening chest  pain (angina)-pain, pressure, or tightness in the chest, neck, back, or arms Side effects that usually do not require medical attention (report to your care team if they continue or are bothersome): Dizziness Flushing Headache This list may not describe all possible side effects. Call your doctor for medical advice about side effects. You may report side effects to FDA at 1-800-FDA-1088. Where should I keep my medication? Keep out of the reach of children. Store between 15 and 30 degrees C (59 and 86 degrees F). Keep container tightly closed. Throw away any unused medication after the expiration date. NOTE: This sheet is a summary. It may not cover all possible information. If you have questions about this medicine, talk to your doctor, pharmacist, or health care provider.  2022 Elsevier/Gold Standard (2020-08-03 08:43:25)

## 2021-03-04 ENCOUNTER — Ambulatory Visit (INDEPENDENT_AMBULATORY_CARE_PROVIDER_SITE_OTHER): Payer: Medicare Other | Admitting: Gastroenterology

## 2021-03-04 ENCOUNTER — Other Ambulatory Visit: Payer: Medicare Other

## 2021-03-04 ENCOUNTER — Other Ambulatory Visit: Payer: Self-pay

## 2021-03-04 ENCOUNTER — Encounter: Payer: Self-pay | Admitting: Gastroenterology

## 2021-03-04 VITALS — BP 120/70 | HR 63 | Ht 70.0 in | Wt 202.0 lb

## 2021-03-04 DIAGNOSIS — Z86010 Personal history of colonic polyps: Secondary | ICD-10-CM

## 2021-03-04 DIAGNOSIS — K2970 Gastritis, unspecified, without bleeding: Secondary | ICD-10-CM | POA: Diagnosis not present

## 2021-03-04 DIAGNOSIS — K5900 Constipation, unspecified: Secondary | ICD-10-CM

## 2021-03-04 DIAGNOSIS — B9681 Helicobacter pylori [H. pylori] as the cause of diseases classified elsewhere: Secondary | ICD-10-CM

## 2021-03-04 DIAGNOSIS — R1310 Dysphagia, unspecified: Secondary | ICD-10-CM | POA: Diagnosis not present

## 2021-03-04 NOTE — Patient Instructions (Signed)
If you are age 68 or older, your body mass index should be between 23-30. Your Body mass index is 28.98 kg/m. If this is out of the aforementioned range listed, please consider follow up with your Primary Care Provider.  If you are age 70 or younger, your body mass index should be between 19-25. Your Body mass index is 28.98 kg/m. If this is out of the aformentioned range listed, please consider follow up with your Primary Care Provider.   __________________________________________________________  The [facility] providers would like to encourage you to use [facility] to communicate with providers for non-urgent requests or questions.  Due to long hold times on the telephone, sending your provider a message by [facility] may be a faster and more efficient way to get a response.  Please allow 48 business hours for a response.  Please remember that this is for non-urgent requests.   Due to recent changes in healthcare laws, you may see the results of your imaging and laboratory studies on MyChart before your provider has had a chance to review them.  We understand that in some cases there may be results that are confusing or concerning to you. Not all laboratory results come back in the same time frame and the provider may be waiting for multiple results in order to interpret others.  Please give [place] 48 hours in order for your provider to thoroughly review all the results before contacting the office for clarification of your results.    Please go to the lab on the 2nd floor [address] before you leave the office today.    Please purchase the following medications over the counter and take as directed: Fiber supplement Colace 100mg   twice a day as needed  Follow up as needed.  Thank you for choosing me and [facility].  Vito Cirigliano, D.O.

## 2021-03-04 NOTE — Progress Notes (Signed)
Chief Complaint:    Dysphagia, procedure f/u, H. pylori follow-up  GI History: 70 year old male with a history of HTN, diabetes, glaucoma, HLD, BPH  s/p prostatectomy 07/2020, initially seen in the GI clinic in 10/2020 for evaluation of dysphagia.  Symptoms have been present for several years, described as solid food dysphagia with occasional odynophagia.  Points to anterior neck/suprasternal notch. Symptoms worse with meats, chicken, bread.  Symptoms becoming more frequent.  No history of food impactions.  No issue with liquids. No associated heartburn, regurgitation and no nausea/vomiting. - 06/2020: Evaluated in the Cardiology Clinic for atypical chest pain. CT coronary completed and without obstructive CAD. - 10/08/2020: Evaluated by [facility] for this issue.  Started on Protonix 40 mg/day with some improvement - 11/25/2020: Evaluated in the GI clinic - 12/22/2020: EGD: Normal esophagus, 15 mm pedunculated gastric polyp.  Empiric dilation and polypectomy not performed due to retained food in stomach. - 01/08/2021: GES normal - 01/19/2021: EGD: Normal esophagus.  Empiric 54 Maloney dilation without mucosal rent.  Biopsies negative for EOE.  15 mm pedunculated gastric polyp removed with snare (path: Hyperplastic polyp), mild H. pylori gastritis.  Treated with quadruple therapy x14 days   Endoscopic History: - [facility] At least one tubular adenoma, multiple hyperplastic polyps per pathology report.  No endoscopy for available for review, but patient recalls 5 year recall.   HPI:     Patient is a 70 y.o. male presenting to the Gastroenterology Clinic for follow-up.   He reports his dysphagia has improved since EGD with empiric dilation. Does have c/o intermittent chest pain, but this is unchanged from prior and has been evaluated in Cardiology Clinic. Sxs only minimally bothersome now. No HB, regurgitation.   Separately, has been having hard stools lately.  Started over the last couple of  weeks.  No hematochezia or melena.  No abdominal pain.  Has not trialed any OTC meds. Requesting stool softener.  Otherwise, feels quite well since last appointment.  Completed quadruple therapy for H. pylori.  No longer taking any acid suppression medications.   Review of systems:     No chest pain, no SOB, no fevers, no urinary sx   Past Medical History:  Diagnosis Date   Annual physical exam 10/19/2018   Atypical chest pain 10/16/2019   BPH (benign prostatic hyperplasia) 10/21/2018   Coronary artery disease    aortic atherosclerosis   Cyanosis 10/16/2019   Elevated PSA    Glaucoma suspect    History of hiatal hernia    small   Hyperlipidemia    Microalbuminuria 07/10/2020   Myopia 07/10/2020   Onychomycosis 07/10/2020   Tinnitus    Vitamin D deficiency     Patient's surgical history, family medical history, social history, medications and allergies were all reviewed in Epic    Current Outpatient Medications  Medication Sig Dispense Refill   atorvastatin (LIPITOR) 10 MG tablet Take 1 tablet (10 mg total) by mouth daily. 90 tablet 1   carboxymethylcellulose (REFRESH PLUS) 0.5 % SOLN Place 1 drop into both eyes 3 (three) times daily as needed (dry eyes).     diclofenac Sodium (VOLTAREN) 1 % GEL Apply small amount along right chest near sternum where painful up to three times a day as needed 100 g 1   fluticasone (FLONASE) 50 MCG/ACT nasal spray Place 2 sprays into both nostrils daily.     isosorbide mononitrate (IMDUR) 30 MG 24 hr tablet Take 1 tablet (30 mg total) by mouth daily. 90  tablet 1   No current facility-administered medications for this visit.    Physical Exam:     BP 120/70   Pulse 63   Ht 5\' 10"  (1.778 m)   Wt 202 lb (91.6 kg)   SpO2 97%   BMI 28.98 kg/m   GENERAL:  Pleasant male in NAD PSYCH: : Cooperative, normal affect CARDIAC:  RRR, no murmur heard, no peripheral edema PULM: Normal respiratory effort, lungs CTA bilaterally, no wheezing ABDOMEN:   Nondistended, soft, nontender. No obvious masses, no hepatomegaly,  normal bowel sounds SKIN:  turgor, no lesions seen Musculoskeletal:  Normal muscle tone, normal strength NEURO: Alert and oriented x 3, no focal neurologic deficits   IMPRESSION and PLAN:    1) Dysphagia -resolved with EGD with empiric dilation - If symptom recurrence, plan for Esophageal Manometry +/- pH/impedance testing  2) H. pylori gastritis - Has completed quadruple therapy - Check H. pylori stool antigen for eradication  3) Constipation - Recent onset.  Possibly due to recent Pepto and/or ABX for H. pylori - Continue adequate hydration with at least 64 ounces of water/day - Start Colace - Start fiber supplement - Increase dietary fiber.  Provided with fiber chart handout today with detailed instructions - If symptoms persist, RTC for further evaluation  4) History of colon polyps - Colonoscopy in 2018 with at least one tubular adenoma with recommendation repeat in 5 years - Repeat colonoscopy in 2023 for ongoing surveillance           2024 ,DO, FACG 03/04/2021, 8:45 AM

## 2021-03-08 DIAGNOSIS — M722 Plantar fascial fibromatosis: Secondary | ICD-10-CM | POA: Diagnosis not present

## 2021-03-08 DIAGNOSIS — M7731 Calcaneal spur, right foot: Secondary | ICD-10-CM | POA: Diagnosis not present

## 2021-03-16 ENCOUNTER — Other Ambulatory Visit: Payer: Self-pay

## 2021-03-16 ENCOUNTER — Other Ambulatory Visit: Payer: Medicare Other

## 2021-03-16 DIAGNOSIS — B9681 Helicobacter pylori [H. pylori] as the cause of diseases classified elsewhere: Secondary | ICD-10-CM

## 2021-03-16 DIAGNOSIS — K5900 Constipation, unspecified: Secondary | ICD-10-CM | POA: Diagnosis not present

## 2021-03-16 DIAGNOSIS — K2970 Gastritis, unspecified, without bleeding: Secondary | ICD-10-CM | POA: Diagnosis not present

## 2021-03-18 LAB — H. PYLORI ANTIGEN, STOOL: H pylori Ag, Stl: NEGATIVE

## 2021-04-01 DIAGNOSIS — M7731 Calcaneal spur, right foot: Secondary | ICD-10-CM | POA: Diagnosis not present

## 2021-04-01 DIAGNOSIS — M722 Plantar fascial fibromatosis: Secondary | ICD-10-CM | POA: Diagnosis not present

## 2021-04-01 DIAGNOSIS — M79671 Pain in right foot: Secondary | ICD-10-CM | POA: Diagnosis not present

## 2021-04-01 DIAGNOSIS — B351 Tinea unguium: Secondary | ICD-10-CM | POA: Diagnosis not present

## 2021-04-01 DIAGNOSIS — M79672 Pain in left foot: Secondary | ICD-10-CM | POA: Diagnosis not present

## 2021-04-02 DIAGNOSIS — N400 Enlarged prostate without lower urinary tract symptoms: Secondary | ICD-10-CM | POA: Diagnosis not present

## 2021-04-02 DIAGNOSIS — I25118 Atherosclerotic heart disease of native coronary artery with other forms of angina pectoris: Secondary | ICD-10-CM | POA: Diagnosis not present

## 2021-04-02 DIAGNOSIS — E782 Mixed hyperlipidemia: Secondary | ICD-10-CM | POA: Diagnosis not present

## 2021-04-03 LAB — LIPID PANEL
Chol/HDL Ratio: 4 ratio (ref 0.0–5.0)
Cholesterol, Total: 147 mg/dL (ref 100–199)
HDL: 37 mg/dL — ABNORMAL LOW (ref >39)
LDL Chol Calc (NIH): 86 mg/dL (ref 0–99)
Triglycerides: 134 mg/dL (ref 0–149)
VLDL Cholesterol Cal: 24 mg/dL (ref 5–40)

## 2021-04-06 ENCOUNTER — Telehealth: Payer: Self-pay | Admitting: Emergency Medicine

## 2021-04-06 ENCOUNTER — Telehealth: Payer: Self-pay | Admitting: Cardiology

## 2021-04-06 DIAGNOSIS — E782 Mixed hyperlipidemia: Secondary | ICD-10-CM

## 2021-04-06 MED ORDER — ATORVASTATIN CALCIUM 20 MG PO TABS: 20.0000 mg | ORAL_TABLET | Freq: Every day | ORAL | 1 refills | Status: DC

## 2021-04-06 NOTE — Telephone Encounter (Signed)
Shane Fowler is returning Hayley's call about Gurpreet's lab results.

## 2021-04-06 NOTE — Telephone Encounter (Signed)
Patient informed of results. Patient will increase lipitor to 20 mg daily and repeat blood work in 6 weeks.

## 2021-04-06 NOTE — Telephone Encounter (Signed)
Patient informed of results see additional encounter.  

## 2021-04-06 NOTE — Telephone Encounter (Signed)
-----   Message from Georgeanna Lea, MD sent at 04/05/2021  8:48 AM EST ----- Cholesterol better but still not perfect.  Please double the dose of Lipitor from 10 mg daily to 20 mg daily, fasting lipid profile, AST ALT need to be repeated in 6 weeks

## 2021-04-13 ENCOUNTER — Encounter: Payer: Self-pay | Admitting: Internal Medicine

## 2021-04-13 ENCOUNTER — Ambulatory Visit (INDEPENDENT_AMBULATORY_CARE_PROVIDER_SITE_OTHER): Payer: Medicare Other | Admitting: Internal Medicine

## 2021-04-13 VITALS — BP 134/78 | HR 67 | Temp 98.2°F | Resp 18 | Ht 70.0 in | Wt 200.1 lb

## 2021-04-13 DIAGNOSIS — R931 Abnormal findings on diagnostic imaging of heart and coronary circulation: Secondary | ICD-10-CM

## 2021-04-13 DIAGNOSIS — E782 Mixed hyperlipidemia: Secondary | ICD-10-CM | POA: Diagnosis not present

## 2021-04-13 DIAGNOSIS — I10 Essential (primary) hypertension: Secondary | ICD-10-CM

## 2021-04-13 DIAGNOSIS — R739 Hyperglycemia, unspecified: Secondary | ICD-10-CM

## 2021-04-13 DIAGNOSIS — R21 Rash and other nonspecific skin eruption: Secondary | ICD-10-CM | POA: Diagnosis not present

## 2021-04-13 LAB — COMPREHENSIVE METABOLIC PANEL
ALT: 24 U/L (ref 0–53)
AST: 19 U/L (ref 0–37)
Albumin: 4.4 g/dL (ref 3.5–5.2)
Alkaline Phosphatase: 62 U/L (ref 39–117)
BUN: 29 mg/dL — ABNORMAL HIGH (ref 6–23)
CO2: 29 mEq/L (ref 19–32)
Calcium: 10.1 mg/dL (ref 8.4–10.5)
Chloride: 104 mEq/L (ref 96–112)
Creatinine, Ser: 0.98 mg/dL (ref 0.40–1.50)
GFR: 79.25 mL/min (ref >60.00)
Glucose, Bld: 100 mg/dL — ABNORMAL HIGH (ref 70–99)
Potassium: 4.1 mEq/L (ref 3.5–5.1)
Sodium: 140 mEq/L (ref 135–145)
Total Bilirubin: 0.4 mg/dL (ref 0.2–1.2)
Total Protein: 7.3 g/dL (ref 6.0–8.3)

## 2021-04-13 LAB — CBC WITH DIFFERENTIAL/PLATELET
Basophils Absolute: 0 10*3/uL (ref 0.0–0.1)
Basophils Relative: 0.8 % (ref 0.0–3.0)
Eosinophils Absolute: 0.1 10*3/uL (ref 0.0–0.7)
Eosinophils Relative: 1.9 % (ref 0.0–5.0)
HCT: 43.5 % (ref 39.0–52.0)
Hemoglobin: 14.5 g/dL (ref 13.0–17.0)
Lymphocytes Relative: 22.9 % (ref 12.0–46.0)
Lymphs Abs: 1.3 10*3/uL (ref 0.7–4.0)
MCHC: 33.4 g/dL (ref 30.0–36.0)
MCV: 89.5 fl (ref 78.0–100.0)
Monocytes Absolute: 0.5 10*3/uL (ref 0.1–1.0)
Monocytes Relative: 8.3 % (ref 3.0–12.0)
Neutro Abs: 3.6 10*3/uL (ref 1.4–7.7)
Neutrophils Relative %: 66.1 % (ref 43.0–77.0)
Platelets: 155 10*3/uL (ref 150.0–400.0)
RBC: 4.86 Mil/uL (ref 4.22–5.81)
RDW: 14.4 % (ref 11.5–15.5)
WBC: 5.5 10*3/uL (ref 4.0–10.5)

## 2021-04-13 LAB — HEMOGLOBIN A1C: Hgb A1c MFr Bld: 6.2 % (ref 4.6–6.5)

## 2021-04-13 MED ORDER — KETOCONAZOLE 2 % EX SHAM: 1.0000 "application " | MEDICATED_SHAMPOO | Freq: Every day | CUTANEOUS | 1 refills | Status: DC | PRN

## 2021-04-13 NOTE — Patient Instructions (Signed)
Vaccine is recommended: Pneumonia shot Flu shot COVID vaccines  Check the  blood pressure regularly BP GOAL is between 110/65 and  135/85. If it is consistently higher or lower, let me know    GO TO THE LAB : Get the blood work     GO TO THE FRONT DESK, PLEASE SCHEDULE YOUR APPOINTMENTS Come back for   a checkup in 6 months
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
## 2021-04-13 NOTE — Assessment & Plan Note (Signed)
Prediabetes: Diet encouraged, check A1c HTN: Currently on no meds (Imdur), BPs seems okay.  Check CMP, check CBC (mild anemia noted). Dysphagia, see LOV, since then saw GI 03/04/2021 for dysphagia, it resolved after EGD with empiric dilatation. Was treated for H. pylori gastritis.  F/U HP test negative Next colonoscopy 2023 DOE/CP, elevated Coronary Ca score Saw cardiology 02/19/2021 Started metoprolol, Lipitor, Imdur.  Subsequently based on FLP, atorvastatin dose increased. F/u w/  cards 05-2021. High cholesterol: See above BPH: Currently doing well with no symptoms. Rash: new problem, likely eczema, recommend OTC hydrocortisone 1% twice daily, once better use Nizoral shampoo daily for maintenance. Immunizations: Declined flu shot, get routine immunizations at the [place] [facility] 6 m

## 2021-04-30 DIAGNOSIS — M722 Plantar fascial fibromatosis: Secondary | ICD-10-CM | POA: Diagnosis not present

## 2021-04-30 DIAGNOSIS — M7731 Calcaneal spur, right foot: Secondary | ICD-10-CM | POA: Diagnosis not present

## 2021-06-01 ENCOUNTER — Ambulatory Visit (INDEPENDENT_AMBULATORY_CARE_PROVIDER_SITE_OTHER): Payer: Medicare Other | Admitting: Cardiology

## 2021-06-01 ENCOUNTER — Encounter: Payer: Self-pay | Admitting: Cardiology

## 2021-06-01 ENCOUNTER — Other Ambulatory Visit: Payer: Self-pay

## 2021-06-01 VITALS — BP 116/72 | HR 62 | Ht 70.0 in | Wt 199.0 lb

## 2021-06-01 DIAGNOSIS — E782 Mixed hyperlipidemia: Secondary | ICD-10-CM | POA: Diagnosis not present

## 2021-06-01 DIAGNOSIS — R0609 Other forms of dyspnea: Secondary | ICD-10-CM | POA: Diagnosis not present

## 2021-06-01 DIAGNOSIS — I25118 Atherosclerotic heart disease of native coronary artery with other forms of angina pectoris: Secondary | ICD-10-CM

## 2021-06-01 DIAGNOSIS — I10 Essential (primary) hypertension: Secondary | ICD-10-CM

## 2021-06-01 NOTE — Patient Instructions (Signed)
Medication Instructions:  Your physician recommends that you continue on your current medications as directed. Please refer to the Current Medication list given to you today.  *If you need a refill on your cardiac medications before your next appointment, please call your pharmacy*   Lab Work: Labs today: LFT, Lipid If you have labs (blood work) drawn today and your tests are completely normal, you will receive your results only by: MyChart Message (if you have MyChart) OR A paper copy in the mail If you have any lab test that is abnormal or we need to change your treatment, we will call you to review the results.   Testing/Procedures: None   Follow-Up: At [facility], you and your health needs are our priority.  As part of our continuing mission to provide you with exceptional heart care, we have created designated Provider Care Teams.  These Care Teams include your primary Cardiologist (physician) and Advanced Practice Providers (APPs -  Physician Assistants and Nurse Practitioners) who all work together to provide you with the care you need, when you need it.  We recommend signing up for the patient portal called "MyChart".  Sign up information is provided on this After Visit Summary.  MyChart is used to connect with patients for Virtual Visits (Telemedicine).  Patients are able to view lab/test results, encounter notes, upcoming appointments, etc.  Non-urgent messages can be sent to your provider as well.   To learn more about what you can do with MyChart, go to [email or web address].    Your next appointment:   6 month(s)  The format for your next appointment:   In Person  Provider:   Gypsy Balsam, MD    Other Instructions None
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
## 2021-06-02 LAB — HEPATIC FUNCTION PANEL
ALT: 24 IU/L (ref 0–44)
AST: 21 IU/L (ref 0–40)
Albumin: 4.9 g/dL — ABNORMAL HIGH (ref 3.8–4.8)
Alkaline Phosphatase: 84 IU/L (ref 44–121)
Bilirubin Total: 0.5 mg/dL (ref 0.0–1.2)
Bilirubin, Direct: 0.14 mg/dL (ref 0.00–0.40)
Total Protein: 7.6 g/dL (ref 6.0–8.5)

## 2021-06-02 LAB — LIPID PANEL
Chol/HDL Ratio: 4.7 ratio (ref 0.0–5.0)
Cholesterol, Total: 151 mg/dL (ref 100–199)
HDL: 32 mg/dL — ABNORMAL LOW (ref >39)
LDL Chol Calc (NIH): 77 mg/dL (ref 0–99)
Triglycerides: 256 mg/dL — ABNORMAL HIGH (ref 0–149)
VLDL Cholesterol Cal: 42 mg/dL — ABNORMAL HIGH (ref 5–40)

## 2021-07-05 DIAGNOSIS — B351 Tinea unguium: Secondary | ICD-10-CM | POA: Diagnosis not present

## 2021-07-05 DIAGNOSIS — M79672 Pain in left foot: Secondary | ICD-10-CM | POA: Diagnosis not present

## 2021-07-05 DIAGNOSIS — Z5181 Encounter for therapeutic drug level monitoring: Secondary | ICD-10-CM | POA: Diagnosis not present

## 2021-07-05 DIAGNOSIS — M79671 Pain in right foot: Secondary | ICD-10-CM | POA: Diagnosis not present

## 2021-08-19 ENCOUNTER — Other Ambulatory Visit: Payer: Self-pay | Admitting: Internal Medicine

## 2021-09-18 ENCOUNTER — Other Ambulatory Visit: Payer: Self-pay | Admitting: Cardiology

## 2021-10-06 DIAGNOSIS — B351 Tinea unguium: Secondary | ICD-10-CM | POA: Diagnosis not present

## 2021-10-06 DIAGNOSIS — M79671 Pain in right foot: Secondary | ICD-10-CM | POA: Diagnosis not present

## 2021-10-06 DIAGNOSIS — M79672 Pain in left foot: Secondary | ICD-10-CM | POA: Diagnosis not present

## 2021-10-12 ENCOUNTER — Ambulatory Visit: Payer: Medicare Other | Admitting: Internal Medicine

## 2021-10-20 ENCOUNTER — Encounter: Payer: Self-pay | Admitting: Internal Medicine

## 2021-10-20 ENCOUNTER — Ambulatory Visit (INDEPENDENT_AMBULATORY_CARE_PROVIDER_SITE_OTHER): Payer: Medicare Other | Admitting: Internal Medicine

## 2021-10-20 VITALS — BP 136/84 | HR 64 | Temp 98.1°F | Resp 18 | Ht 70.0 in | Wt 203.5 lb

## 2021-10-20 DIAGNOSIS — I10 Essential (primary) hypertension: Secondary | ICD-10-CM | POA: Diagnosis not present

## 2021-10-20 DIAGNOSIS — R739 Hyperglycemia, unspecified: Secondary | ICD-10-CM | POA: Diagnosis not present

## 2021-10-20 DIAGNOSIS — E010 Iodine-deficiency related diffuse (endemic) goiter: Secondary | ICD-10-CM | POA: Diagnosis not present

## 2021-10-20 DIAGNOSIS — L0291 Cutaneous abscess, unspecified: Secondary | ICD-10-CM | POA: Diagnosis not present

## 2021-10-20 DIAGNOSIS — E782 Mixed hyperlipidemia: Secondary | ICD-10-CM | POA: Diagnosis not present

## 2021-10-20 DIAGNOSIS — I25118 Atherosclerotic heart disease of native coronary artery with other forms of angina pectoris: Secondary | ICD-10-CM | POA: Diagnosis not present

## 2021-10-20 LAB — BASIC METABOLIC PANEL
BUN: 16 mg/dL (ref 6–23)
CO2: 31 mEq/L (ref 19–32)
Calcium: 9.5 mg/dL (ref 8.4–10.5)
Chloride: 102 mEq/L (ref 96–112)
Creatinine, Ser: 0.88 mg/dL (ref 0.40–1.50)
GFR: 88.05 mL/min (ref >60.00)
Glucose, Bld: 100 mg/dL — ABNORMAL HIGH (ref 70–99)
Potassium: 4.3 mEq/L (ref 3.5–5.1)
Sodium: 139 mEq/L (ref 135–145)

## 2021-10-20 LAB — HEMOGLOBIN A1C: Hgb A1c MFr Bld: 6.4 % (ref 4.6–6.5)

## 2021-10-20 LAB — TSH: TSH: 2.01 u[IU]/mL (ref 0.35–5.50)

## 2021-10-20 MED ORDER — DOXYCYCLINE HYCLATE 100 MG PO TABS: 100.0000 mg | ORAL_TABLET | Freq: Two times a day (BID) | ORAL | 0 refills | Status: DC

## 2021-10-20 NOTE — Patient Instructions (Addendum)
Recommend to proceed with Shingrix (shingles #2) at your pharmacy or the [place].  Please review the information about healthcare power of attorney.   Take antibiotic for 1 week for the infected cyst on your back.  (Doxycycline) If in 4 weeks the area is not back to completely normal please come back and let me recheck it.   GO TO THE LAB : Get the blood work     GO TO THE FRONT DESK, PLEASE SCHEDULE YOUR APPOINTMENTS Come back for   a checkup in 6 months   STOP BY THE FIRST FLOOR: Schedule the ultrasound of your thyroid
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
## 2021-10-21 ENCOUNTER — Ambulatory Visit (HOSPITAL_BASED_OUTPATIENT_CLINIC_OR_DEPARTMENT_OTHER)
Admission: RE | Admit: 2021-10-21 | Discharge: 2021-10-21 | Disposition: A | Discharge status: Home / Self Care | Payer: Medicare Other | Source: Ambulatory Visit | Attending: Internal Medicine | Admitting: Internal Medicine

## 2021-10-21 ENCOUNTER — Encounter: Payer: Self-pay | Admitting: Internal Medicine

## 2021-10-21 DIAGNOSIS — E010 Iodine-deficiency related diffuse (endemic) goiter: Secondary | ICD-10-CM | POA: Diagnosis not present

## 2021-10-21 DIAGNOSIS — E041 Nontoxic single thyroid nodule: Secondary | ICD-10-CM | POA: Diagnosis not present

## 2021-10-21 NOTE — Assessment & Plan Note (Signed)
Preventive care reviewed Immunizations at [facility] . CCS: Cscope done ~ 2018 per pt at [facility] Prostatectomy 07/2020 [facility]), states has a f/u w/ urology ~ [phone]  Healthcare power of attorney information provided

## 2021-10-21 NOTE — Assessment & Plan Note (Signed)
Prediabetes: Encourage a healthy diet, check A1c. HTN: BP is very good, on Imdur, check BMP High cholesterol: On atorvastatin, last LDL satisfactory CAD, nonobstructive: Saw cardiology 05-2021,  was rec to go back on aspirin.  If intolerant to ASA would consider Plavix. Infected cyst: Has 2 cysts on the upper back, see physical exam, culture sent, Rx doxycycline.  If not completely back to normal, he will come back for a checkup.  See AVS Thyromegaly: R thyromegaly on exam, check ultrasound and TSH Preventive care reviewed. RTC 6 m

## 2021-10-23 LAB — WOUND CULTURE
MICRO NUMBER:: 13553655
SPECIMEN QUALITY:: ADEQUATE

## 2021-11-08 ENCOUNTER — Ambulatory Visit: Payer: Medicare Other | Admitting: Cardiology
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
## 2021-11-16 ENCOUNTER — Ambulatory Visit (INDEPENDENT_AMBULATORY_CARE_PROVIDER_SITE_OTHER): Payer: Medicare Other

## 2021-11-16 VITALS — BP 116/73 | HR 67 | Temp 98.2°F | Resp 16 | Ht 70.0 in | Wt 197.6 lb

## 2021-11-16 DIAGNOSIS — Z0000 Encounter for general adult medical examination without abnormal findings: Secondary | ICD-10-CM

## 2021-11-16 NOTE — Patient Instructions (Signed)
[name] , Thank you for taking time to come for your Medicare Wellness Visit. I appreciate your ongoing commitment to your health goals. Please review the following plan we discussed and let me know if I can assist you in the future.   Screening recommendations/referrals: Colonoscopy: 07/12/17 due 07/13/22 Recommended yearly ophthalmology/optometry visit for glaucoma screening and checkup Recommended yearly dental visit for hygiene and checkup  Vaccinations: Influenza vaccine: Due-May obtain vaccine at our office or your local pharmacy.  Pneumococcal vaccine: Due-May obtain vaccine at your local pharmacy.  Tdap vaccine: up to date Shingles vaccine: Due-May obtain vaccine at our office or your local pharmacy.    Covid-19: Due-May obtain vaccine at our office or your local pharmacy.   Advanced directives: no  Conditions/risks identified: see problem list   Next appointment: Follow up in one year for your annual wellness visit. 11/18/22  Preventive Care 69 Years and Older, Male Preventive care refers to lifestyle choices and visits with your health care provider that can promote health and wellness. What does preventive care include? A yearly physical exam. This is also called an annual well check. Dental exams once or twice a year. Routine eye exams. Ask your health care provider how often you should have your eyes checked. Personal lifestyle choices, including: Daily care of your teeth and gums. Regular physical activity. Eating a healthy diet. Avoiding tobacco and drug use. Limiting alcohol use. Practicing safe sex. Taking low doses of aspirin every day. Taking vitamin and mineral supplements as recommended by your health care provider. What happens during an annual well check? The services and screenings done by your health care provider during your annual well check will depend on your age, overall health, lifestyle risk factors, and family history of disease. Counseling   Your health care provider may ask you questions about your: Alcohol use. Tobacco use. Drug use. Emotional well-being. Home and relationship well-being. Sexual activity. Eating habits. History of falls. Memory and ability to understand (cognition). Work and work Astronomer. Screening  You may have the following tests or measurements: Height, weight, and BMI. Blood pressure. Lipid and cholesterol levels. These may be checked every 5 years, or more frequently if you are over 69 years old. Skin check. Lung cancer screening. You may have this screening every year starting at age 69 if you have a 30-pack-year history of smoking and currently smoke or have quit within the past 15 years. Fecal occult blood test (FOBT) of the stool. You may have this test every year starting at age 69. Flexible sigmoidoscopy or colonoscopy. You may have a sigmoidoscopy every 5 years or a colonoscopy every 10 years starting at age 69. Prostate cancer screening. Recommendations will vary depending on your family history and other risks. Hepatitis C blood test. Hepatitis B blood test. Sexually transmitted disease (STD) testing. Diabetes screening. This is done by checking your blood sugar (glucose) after you have not eaten for a while (fasting). You may have this done every 1-3 years. Abdominal aortic aneurysm (AAA) screening. You may need this if you are a current or former smoker. Osteoporosis. You may be screened starting at age 69 if you are at high risk. Talk with your health care provider about your test results, treatment options, and if necessary, the need for more tests. Vaccines  Your health care provider may recommend certain vaccines, such as: Influenza vaccine. This is recommended every year. Tetanus, diphtheria, and acellular pertussis (Tdap, Td) vaccine. You may need a Td booster every 10 years.  Zoster vaccine. You may need this after age 69. Pneumococcal 13-valent conjugate (PCV13) vaccine.  One dose is recommended after age 69. Pneumococcal polysaccharide (PPSV23) vaccine. One dose is recommended after age 69. Talk to your health care provider about which screenings and vaccines you need and how often you need them. This information is not intended to replace advice given to you by your health care provider. Make sure you discuss any questions you have with your health care provider. Document Released: 05/15/2015 Document Revised: 01/06/2016 Document Reviewed: 02/17/2015 Elsevier Interactive Patient Education  2017 [place] Prevention in the Home Falls can cause injuries. They can happen to people of all ages. There are many things you can do to make your home safe and to help prevent falls. What can I do on the outside of my home? Regularly fix the edges of walkways and driveways and fix any cracks. Remove anything that might make you trip as you walk through a door, such as a raised step or threshold. Trim any bushes or trees on the path to your home. Use bright outdoor lighting. Clear any walking paths of anything that might make someone trip, such as rocks or tools. Regularly check to see if handrails are loose or broken. Make sure that both sides of any steps have handrails. Any raised decks and porches should have guardrails on the edges. Have any leaves, snow, or ice cleared regularly. Use sand or salt on walking paths during winter. Clean up any spills in your garage right away. This includes oil or grease spills. What can I do in the bathroom? Use night lights. Install grab bars by the toilet and in the tub and shower. Do not use towel bars as grab bars. Use non-skid mats or decals in the tub or shower. If you need to sit down in the shower, use a plastic, non-slip stool. Keep the floor dry. Clean up any water that spills on the floor as soon as it happens. Remove soap buildup in the tub or shower regularly. Attach bath mats securely with double-sided  non-slip rug tape. Do not have throw rugs and other things on the floor that can make you trip. What can I do in the bedroom? Use night lights. Make sure that you have a light by your bed that is easy to reach. Do not use any sheets or blankets that are too big for your bed. They should not hang down onto the floor. Have a firm chair that has side arms. You can use this for support while you get dressed. Do not have throw rugs and other things on the floor that can make you trip. What can I do in the kitchen? Clean up any spills right away. Avoid walking on wet floors. Keep items that you use a lot in easy-to-reach places. If you need to reach something above you, use a strong step stool that has a grab bar. Keep electrical cords out of the way. Do not use floor polish or wax that makes floors slippery. If you must use wax, use non-skid floor wax. Do not have throw rugs and other things on the floor that can make you trip. What can I do with my stairs? Do not leave any items on the stairs. Make sure that there are handrails on both sides of the stairs and use them. Fix handrails that are broken or loose. Make sure that handrails are as long as the stairways. Check any carpeting to make sure that  it is firmly attached to the stairs. Fix any carpet that is loose or worn. Avoid having throw rugs at the top or bottom of the stairs. If you do have throw rugs, attach them to the floor with carpet tape. Make sure that you have a light switch at the top of the stairs and the bottom of the stairs. If you do not have them, ask someone to add them for you. What else can I do to help prevent falls? Wear shoes that: Do not have high heels. Have rubber bottoms. Are comfortable and fit you well. Are closed at the toe. Do not wear sandals. If you use a stepladder: Make sure that it is fully opened. Do not climb a closed stepladder. Make sure that both sides of the stepladder are locked into place. Ask  someone to hold it for you, if possible. Clearly mark and make sure that you can see: Any grab bars or handrails. First and last steps. Where the edge of each step is. Use tools that help you move around (mobility aids) if they are needed. These include: Canes. Walkers. Scooters. Crutches. Turn on the lights when you go into a dark area. Replace any light bulbs as soon as they burn out. Set up your furniture so you have a clear path. Avoid moving your furniture around. If any of your floors are uneven, fix them. If there are any pets around you, be aware of where they are. Review your medicines with your doctor. Some medicines can make you feel dizzy. This can increase your chance of falling. Ask your doctor what other things that you can do to help prevent falls. This information is not intended to replace advice given to you by your health care provider. Make sure you discuss any questions you have with your health care provider. Document Released: 02/12/2009 Document Revised: 09/24/2015 Document Reviewed: 05/23/2014 Elsevier Interactive Patient Education  2017 [employer].

## 2021-11-24 DIAGNOSIS — Z125 Encounter for screening for malignant neoplasm of prostate: Secondary | ICD-10-CM | POA: Diagnosis not present

## 2021-11-24 LAB — PSA: PSA: 0.15

## 2021-12-01 ENCOUNTER — Other Ambulatory Visit: Payer: Self-pay | Admitting: Cardiology

## 2021-12-01 DIAGNOSIS — N401 Enlarged prostate with lower urinary tract symptoms: Secondary | ICD-10-CM | POA: Diagnosis not present

## 2021-12-01 DIAGNOSIS — R351 Nocturia: Secondary | ICD-10-CM | POA: Diagnosis not present

## 2021-12-01 DIAGNOSIS — R972 Elevated prostate specific antigen [PSA]: Secondary | ICD-10-CM | POA: Diagnosis not present

## 2021-12-02 ENCOUNTER — Encounter: Payer: Self-pay | Admitting: Internal Medicine

## 2021-12-02 DIAGNOSIS — Z905 Acquired absence of kidney: Secondary | ICD-10-CM | POA: Insufficient documentation

## 2022-01-10 DIAGNOSIS — B351 Tinea unguium: Secondary | ICD-10-CM | POA: Diagnosis not present

## 2022-01-10 DIAGNOSIS — M79671 Pain in right foot: Secondary | ICD-10-CM | POA: Diagnosis not present

## 2022-01-10 DIAGNOSIS — M79672 Pain in left foot: Secondary | ICD-10-CM | POA: Diagnosis not present

## 2022-02-03 ENCOUNTER — Encounter: Payer: Self-pay | Admitting: Cardiology

## 2022-02-03 ENCOUNTER — Ambulatory Visit: Payer: Medicare Other | Attending: Cardiology | Admitting: Cardiology

## 2022-02-03 VITALS — BP 102/64 | HR 61 | Ht 70.0 in | Wt 194.0 lb

## 2022-02-03 DIAGNOSIS — I10 Essential (primary) hypertension: Secondary | ICD-10-CM | POA: Diagnosis not present

## 2022-02-03 DIAGNOSIS — R0789 Other chest pain: Secondary | ICD-10-CM | POA: Diagnosis not present

## 2022-02-03 DIAGNOSIS — I25118 Atherosclerotic heart disease of native coronary artery with other forms of angina pectoris: Secondary | ICD-10-CM | POA: Diagnosis not present

## 2022-02-03 DIAGNOSIS — R0609 Other forms of dyspnea: Secondary | ICD-10-CM | POA: Diagnosis not present

## 2022-02-03 MED ORDER — RANOLAZINE ER 500 MG PO TB12: 500.0000 mg | ORAL_TABLET | Freq: Two times a day (BID) | ORAL | 3 refills | Status: DC
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
## 2022-02-03 NOTE — Patient Instructions (Signed)
Medication Instructions:  Your physician has recommended you make the following change in your medication:   START:Ranolazine 500mg  1 tablet twice daily by mouth    Lab Work: Lipid panel- Today 2nd [place] [address] If you have labs (blood work) drawn today and your tests are completely normal, you will receive your results only by: [place] (if you have MyChart) OR A paper copy in the mail If you have any lab test that is abnormal or we need to change your treatment, we will call you to review the results.   Testing/Procedures: None Ordered   Follow-Up: At [facility], you and your health needs are our priority.  As part of our continuing mission to provide you with exceptional heart care, we have created designated Provider Care Teams.  These Care Teams include your primary Cardiologist (physician) and Advanced Practice Providers (APPs -  Physician Assistants and Nurse Practitioners) who all work together to provide you with the care you need, when you need it.  We recommend signing up for the patient portal called "MyChart".  Sign up information is provided on this After Visit Summary.  MyChart is used to connect with patients for Virtual Visits (Telemedicine).  Patients are able to view lab/test results, encounter notes, upcoming appointments, etc.  Non-urgent messages can be sent to your provider as well.   To learn more about what you can do with MyChart, go to [email or web address].    Your next appointment:   6 month(s)  The format for your next appointment:   In Person  Provider:   Jenne Campus, MD    Other Instructions NA

## 2022-02-04 LAB — LIPID PANEL
Chol/HDL Ratio: 5.5 ratio — ABNORMAL HIGH (ref 0.0–5.0)
Cholesterol, Total: 182 mg/dL (ref 100–199)
HDL: 33 mg/dL — ABNORMAL LOW (ref >39)
LDL Chol Calc (NIH): 110 mg/dL — ABNORMAL HIGH (ref 0–99)
Triglycerides: 221 mg/dL — ABNORMAL HIGH (ref 0–149)
VLDL Cholesterol Cal: 39 mg/dL (ref 5–40)

## 2022-02-10 ENCOUNTER — Encounter: Payer: Self-pay | Admitting: Cardiology

## 2022-02-18 ENCOUNTER — Telehealth: Payer: Self-pay

## 2022-02-18 DIAGNOSIS — E782 Mixed hyperlipidemia: Secondary | ICD-10-CM

## 2022-02-18 MED ORDER — ATORVASTATIN CALCIUM 40 MG PO TABS: 40.0000 mg | ORAL_TABLET | Freq: Every day | ORAL | 0 refills | Status: DC

## 2022-02-18 NOTE — Telephone Encounter (Signed)
Patient notified of results and recommendations and agreed with plan. Rx sent lab order on file. He is aware to fast for his blood work.

## 2022-02-18 NOTE — Telephone Encounter (Signed)
-----   Message from Park Liter, MD sent at 02/04/2022  8:29 AM EDT ----- Cholesterol still not where it supposed to be.  Double the dose of Lipitor to 40 mg daily, fasting lipid profile, AST LT 6 weeks

## 2022-04-11 DIAGNOSIS — B351 Tinea unguium: Secondary | ICD-10-CM | POA: Diagnosis not present

## 2022-04-11 DIAGNOSIS — M79671 Pain in right foot: Secondary | ICD-10-CM | POA: Diagnosis not present

## 2022-04-11 DIAGNOSIS — M79672 Pain in left foot: Secondary | ICD-10-CM | POA: Diagnosis not present

## 2022-04-21 ENCOUNTER — Encounter: Payer: Self-pay | Admitting: Internal Medicine

## 2022-04-21 ENCOUNTER — Ambulatory Visit (INDEPENDENT_AMBULATORY_CARE_PROVIDER_SITE_OTHER): Payer: Medicare Other | Admitting: Internal Medicine

## 2022-04-21 VITALS — BP 120/68 | HR 69 | Temp 97.8°F | Resp 18 | Ht 70.0 in | Wt 191.0 lb

## 2022-04-21 DIAGNOSIS — I25118 Atherosclerotic heart disease of native coronary artery with other forms of angina pectoris: Secondary | ICD-10-CM

## 2022-04-21 DIAGNOSIS — I10 Essential (primary) hypertension: Secondary | ICD-10-CM

## 2022-04-21 DIAGNOSIS — E782 Mixed hyperlipidemia: Secondary | ICD-10-CM

## 2022-04-21 DIAGNOSIS — R739 Hyperglycemia, unspecified: Secondary | ICD-10-CM

## 2022-04-21 LAB — LIPID PANEL
Cholesterol: 179 mg/dL (ref 0–200)
HDL: 31.8 mg/dL — ABNORMAL LOW (ref >39.00)
NonHDL: 147.54
Total CHOL/HDL Ratio: 6
Triglycerides: 213 mg/dL — ABNORMAL HIGH (ref 0.0–149.0)
VLDL: 42.6 mg/dL — ABNORMAL HIGH (ref 0.0–40.0)

## 2022-04-21 LAB — CBC WITH DIFFERENTIAL/PLATELET
Basophils Absolute: 0 10*3/uL (ref 0.0–0.1)
Basophils Relative: 0.8 % (ref 0.0–3.0)
Eosinophils Absolute: 0.2 10*3/uL (ref 0.0–0.7)
Eosinophils Relative: 3.6 % (ref 0.0–5.0)
HCT: 43.4 % (ref 39.0–52.0)
Hemoglobin: 14.8 g/dL (ref 13.0–17.0)
Lymphocytes Relative: 25.6 % (ref 12.0–46.0)
Lymphs Abs: 1.3 10*3/uL (ref 0.7–4.0)
MCHC: 34.1 g/dL (ref 30.0–36.0)
MCV: 90.5 fl (ref 78.0–100.0)
Monocytes Absolute: 0.5 10*3/uL (ref 0.1–1.0)
Monocytes Relative: 9.5 % (ref 3.0–12.0)
Neutro Abs: 3 10*3/uL (ref 1.4–7.7)
Neutrophils Relative %: 60.5 % (ref 43.0–77.0)
Platelets: 156 10*3/uL (ref 150.0–400.0)
RBC: 4.79 Mil/uL (ref 4.22–5.81)
RDW: 13.5 % (ref 11.5–15.5)
WBC: 4.9 10*3/uL (ref 4.0–10.5)

## 2022-04-21 LAB — COMPREHENSIVE METABOLIC PANEL
ALT: 18 U/L (ref 0–53)
AST: 17 U/L (ref 0–37)
Albumin: 4.4 g/dL (ref 3.5–5.2)
Alkaline Phosphatase: 71 U/L (ref 39–117)
BUN: 23 mg/dL (ref 6–23)
CO2: 30 mEq/L (ref 19–32)
Calcium: 9.5 mg/dL (ref 8.4–10.5)
Chloride: 103 mEq/L (ref 96–112)
Creatinine, Ser: 0.83 mg/dL (ref 0.40–1.50)
GFR: 89.3 mL/min (ref >60.00)
Glucose, Bld: 90 mg/dL (ref 70–99)
Potassium: 4 mEq/L (ref 3.5–5.1)
Sodium: 141 mEq/L (ref 135–145)
Total Bilirubin: 0.5 mg/dL (ref 0.2–1.2)
Total Protein: 7.2 g/dL (ref 6.0–8.3)

## 2022-04-21 LAB — LDL CHOLESTEROL, DIRECT: Direct LDL: 112 mg/dL

## 2022-04-21 LAB — HEMOGLOBIN A1C: Hgb A1c MFr Bld: 6.1 % (ref 4.6–6.5)

## 2022-04-21 NOTE — Assessment & Plan Note (Signed)
Prediabetes: Check A1c, advised about diet HTN: Seems well-controlled, recommend to check ambulatory BPs.  Continue Imdur.  Check CMP and CBC High cholesterol: Saw cardiology 02/03/2022, cholesterol elevated, Lipitor increased to 40 mg.  Will recheck FLP today. CAD nonobstructive: No symptoms Ventral hernia?  See physical exam, suspect this is a diastasis recti.  Observation, seek help if symptoms Thyromegaly?  See LOV, [place] 09/2021 negative.  No further [place] needed. Preventive care: Declined a flu shot, recommend Shingrix No. 2, PNM 20: Plans to proceed at the [place]. RTC 6 to 7 months.
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
## 2022-04-21 NOTE — Patient Instructions (Addendum)
Vaccines I recommend:  Shingrix (shingles) #2 RSV vaccine Flu vaccine Pneumonia shot  (PNM 20)  Check the  blood pressure regularly BP GOAL is between 110/65 and  135/85. If it is consistently higher or lower, let me know     GO TO THE LAB : Get the blood work     GO TO THE FRONT DESK, PLEASE SCHEDULE YOUR APPOINTMENTS Come back for   a checkup in 6 to 7 months

## 2022-04-29 ENCOUNTER — Other Ambulatory Visit: Payer: Self-pay

## 2022-04-29 DIAGNOSIS — E782 Mixed hyperlipidemia: Secondary | ICD-10-CM

## 2022-04-29 MED ORDER — ATORVASTATIN CALCIUM 80 MG PO TABS: 80.0000 mg | ORAL_TABLET | Freq: Every day | ORAL | 0 refills | Status: DC

## 2022-05-21 IMAGING — DX DG CHEST 2V
2 series · 2 of 2 positions shown · non-contrast
Comparison: Prior chest radiograph 12/22/2009, chest CT 11/23/2003

CLINICAL DATA: Chest pain. Additional provided: Mid chest pain for
1 month.

EXAM:
CHEST - 2 VIEW

[chest pa]
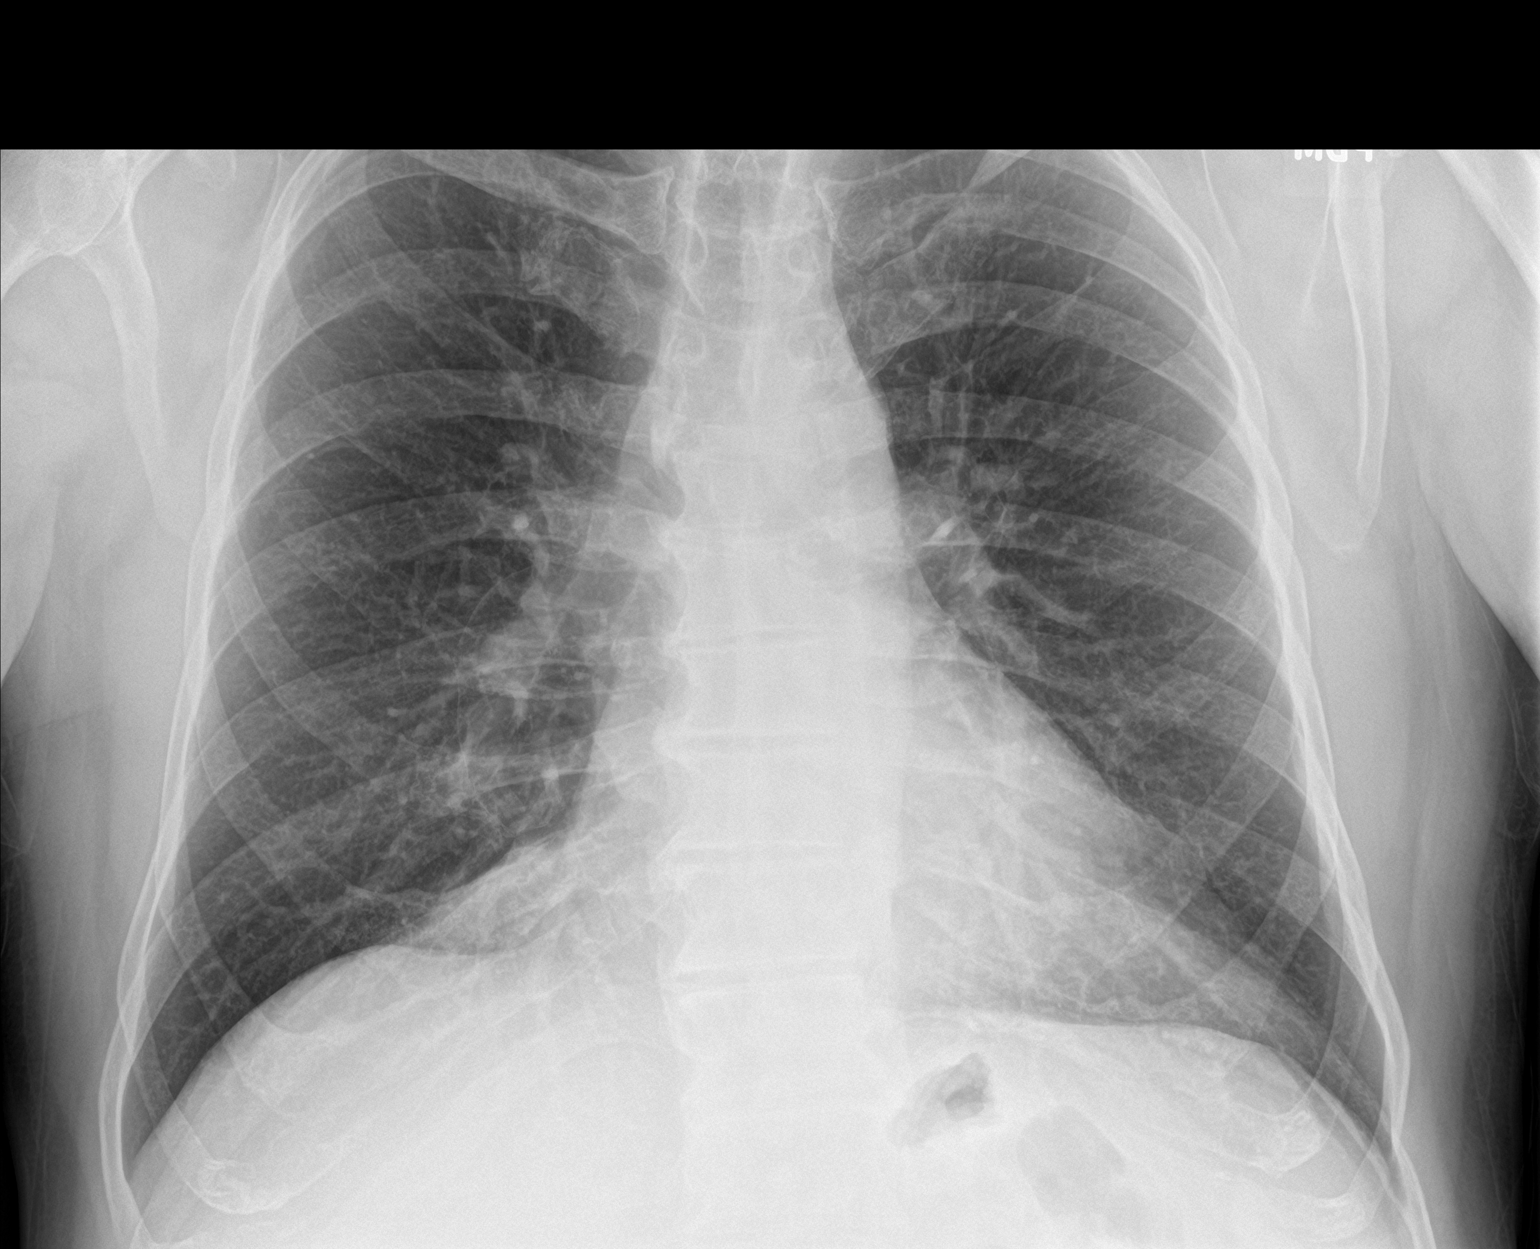

[chest lat]
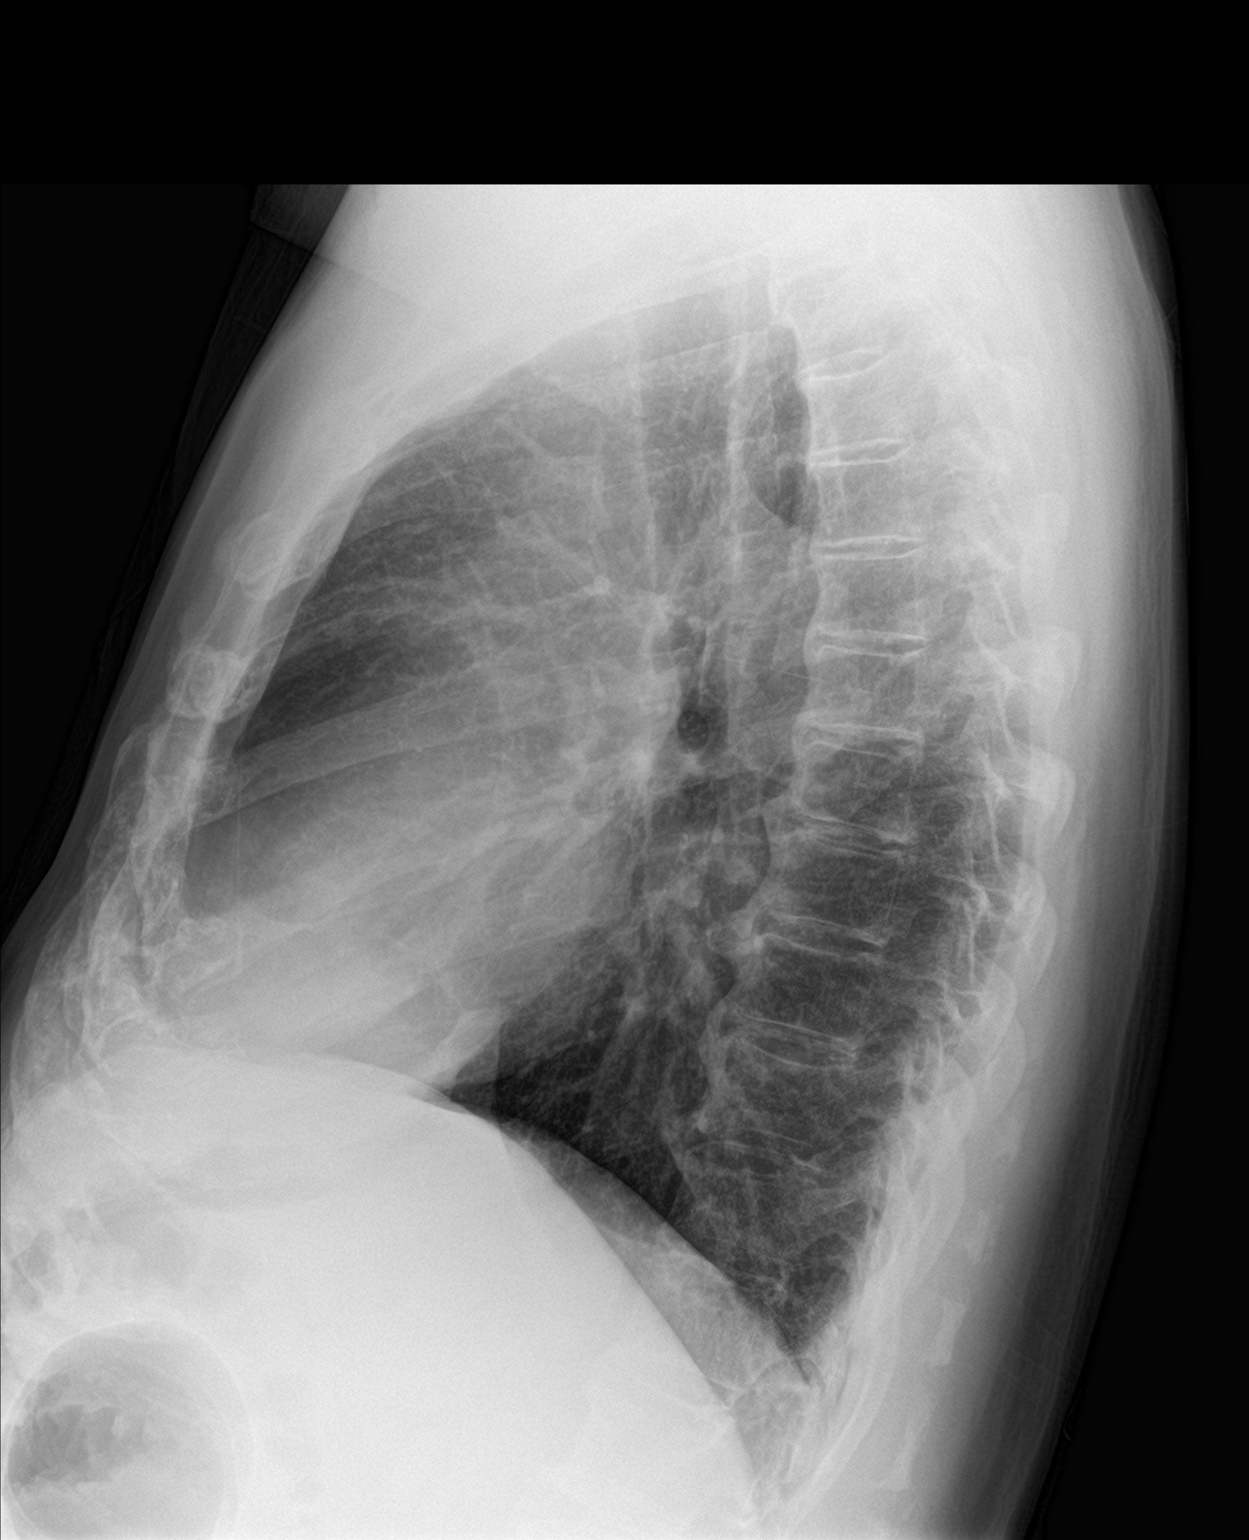

[2 of 2 positions shown; findings below may reference images not displayed]

FINDINGS: Mild cardiomegaly, unchanged. Minimal atelectasis and/or scarring
within the medial right lung base. Redemonstrated bulla within the
right perihilar region. No appreciable airspace consolidation. No
evidence of pleural effusion or pneumothorax. No acute bony
abnormality identified. Thoracic spondylosis.
IMPRESSION: Minimal atelectasis and/or scarring within the right lung base.

Redemonstrated bulla in the right perihilar region.

No appreciable airspace consolidation.  No pulmonary edema.

Unchanged mild cardiomegaly.

## 2022-06-22 ENCOUNTER — Encounter: Payer: Self-pay | Admitting: Internal Medicine

## 2022-07-02 ENCOUNTER — Other Ambulatory Visit: Payer: Self-pay | Admitting: Internal Medicine

## 2022-07-15 DIAGNOSIS — M79671 Pain in right foot: Secondary | ICD-10-CM | POA: Diagnosis not present

## 2022-07-15 DIAGNOSIS — B351 Tinea unguium: Secondary | ICD-10-CM | POA: Diagnosis not present

## 2022-07-15 DIAGNOSIS — M79672 Pain in left foot: Secondary | ICD-10-CM | POA: Diagnosis not present

## 2022-07-28 ENCOUNTER — Other Ambulatory Visit: Payer: Medicare Other

## 2022-08-01 ENCOUNTER — Other Ambulatory Visit: Payer: Self-pay | Admitting: Internal Medicine

## 2022-08-12 ENCOUNTER — Other Ambulatory Visit: Payer: Self-pay | Admitting: Internal Medicine

## 2022-10-07 DIAGNOSIS — B351 Tinea unguium: Secondary | ICD-10-CM | POA: Diagnosis not present

## 2022-10-07 DIAGNOSIS — M79672 Pain in left foot: Secondary | ICD-10-CM | POA: Diagnosis not present

## 2022-10-07 DIAGNOSIS — M79671 Pain in right foot: Secondary | ICD-10-CM | POA: Diagnosis not present

## 2022-10-07 DIAGNOSIS — L84 Corns and callosities: Secondary | ICD-10-CM | POA: Diagnosis not present

## 2022-11-02 ENCOUNTER — Other Ambulatory Visit: Payer: Self-pay | Admitting: Internal Medicine

## 2022-11-07 ENCOUNTER — Encounter: Payer: Self-pay | Admitting: Internal Medicine

## 2022-11-07 ENCOUNTER — Ambulatory Visit: Payer: Medicare Other | Admitting: Internal Medicine

## 2022-11-07 ENCOUNTER — Telehealth: Payer: Self-pay | Admitting: Internal Medicine
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
## 2022-11-21 ENCOUNTER — Encounter: Payer: Self-pay | Admitting: Internal Medicine

## 2022-11-21 ENCOUNTER — Ambulatory Visit (INDEPENDENT_AMBULATORY_CARE_PROVIDER_SITE_OTHER): Payer: Medicare Other | Admitting: *Deleted

## 2022-11-21 ENCOUNTER — Ambulatory Visit (INDEPENDENT_AMBULATORY_CARE_PROVIDER_SITE_OTHER): Payer: Medicare Other | Admitting: Internal Medicine

## 2022-11-21 VITALS — BP 126/62 | HR 60 | Temp 98.1°F | Resp 16 | Ht 70.0 in | Wt 192.4 lb

## 2022-11-21 DIAGNOSIS — I10 Essential (primary) hypertension: Secondary | ICD-10-CM | POA: Diagnosis not present

## 2022-11-21 DIAGNOSIS — R21 Rash and other nonspecific skin eruption: Secondary | ICD-10-CM

## 2022-11-21 DIAGNOSIS — Z0000 Encounter for general adult medical examination without abnormal findings: Secondary | ICD-10-CM | POA: Diagnosis not present

## 2022-11-21 DIAGNOSIS — L723 Sebaceous cyst: Secondary | ICD-10-CM

## 2022-11-21 DIAGNOSIS — E782 Mixed hyperlipidemia: Secondary | ICD-10-CM | POA: Diagnosis not present

## 2022-11-21 LAB — BASIC METABOLIC PANEL
BUN: 25 mg/dL — ABNORMAL HIGH (ref 6–23)
CO2: 28 mEq/L (ref 19–32)
Calcium: 9.7 mg/dL (ref 8.4–10.5)
Chloride: 102 mEq/L (ref 96–112)
Creatinine, Ser: 1.02 mg/dL (ref 0.40–1.50)
GFR: 74.69 mL/min (ref >60.00)
Glucose, Bld: 108 mg/dL — ABNORMAL HIGH (ref 70–99)
Potassium: 4.4 mEq/L (ref 3.5–5.1)
Sodium: 140 mEq/L (ref 135–145)

## 2022-11-21 LAB — LDL CHOLESTEROL, DIRECT: Direct LDL: 99 mg/dL

## 2022-11-21 LAB — LIPID PANEL
Cholesterol: 180 mg/dL (ref 0–200)
HDL: 32.6 mg/dL — ABNORMAL LOW (ref >39.00)
NonHDL: 147.84
Total CHOL/HDL Ratio: 6
Triglycerides: 272 mg/dL — ABNORMAL HIGH (ref 0.0–149.0)
VLDL: 54.4 mg/dL — ABNORMAL HIGH (ref 0.0–40.0)

## 2022-11-21 LAB — AST: AST: 19 U/L (ref 0–37)

## 2022-11-21 LAB — ALT: ALT: 20 U/L (ref 0–53)

## 2022-11-21 MED ORDER — ISOSORBIDE MONONITRATE ER 30 MG PO TB24: 30.0000 mg | ORAL_TABLET | Freq: Every day | ORAL | 1 refills | Status: DC

## 2022-11-21 MED ORDER — ATORVASTATIN CALCIUM 80 MG PO TABS: 80.0000 mg | ORAL_TABLET | Freq: Every day | ORAL | 1 refills | Status: DC

## 2022-11-21 MED ORDER — DOXYCYCLINE HYCLATE 100 MG PO TABS: 100.0000 mg | ORAL_TABLET | Freq: Two times a day (BID) | ORAL | 0 refills | Status: DC

## 2022-11-21 MED ORDER — HYDROCORTISONE 2.5 % EX CREA: TOPICAL_CREAM | Freq: Two times a day (BID) | CUTANEOUS | 3 refills | Status: DC

## 2022-11-21 NOTE — Patient Instructions (Signed)
[name] , Thank you for taking time to come for your Medicare Wellness Visit. I appreciate your ongoing commitment to your health goals. Please review the following plan we discussed and let me know if I can assist you in the future.     This is a list of the screening recommended for you and due dates:  Health Maintenance  Topic Date Due   Zoster (Shingles) Vaccine (2 of 2) 08/08/2018   Colon Cancer Screening  07/13/2022   Pneumonia Vaccine (2 of 2 - PCV) 04/22/2023*   Flu Shot  12/01/2022   DTaP/Tdap/Td vaccine (2 - Td or Tdap) 10/31/2023   Medicare Annual Wellness Visit  11/21/2023   Hepatitis C Screening  Completed   HPV Vaccine  Aged Out   COVID-19 Vaccine  Discontinued  *Topic was postponed. The date shown is not the original due date.    Next appointment: Follow up in one year for your annual wellness visit.   Preventive Care 70 Years and Older, Male Preventive care refers to lifestyle choices and visits with your health care provider that can promote health and wellness. What does preventive care include? A yearly physical exam. This is also called an annual well check. Dental exams once or twice a year. Routine eye exams. Ask your health care provider how often you should have your eyes checked. Personal lifestyle choices, including: Daily care of your teeth and gums. Regular physical activity. Eating a healthy diet. Avoiding tobacco and drug use. Limiting alcohol use. Practicing safe sex. Taking low doses of aspirin every day. Taking vitamin and mineral supplements as recommended by your health care provider. What happens during an annual well check? The services and screenings done by your health care provider during your annual well check will depend on your age, overall health, lifestyle risk factors, and family history of disease. Counseling  Your health care provider may ask you questions about your: Alcohol use. Tobacco use. Drug use. Emotional  well-being. Home and relationship well-being. Sexual activity. Eating habits. History of falls. Memory and ability to understand (cognition). Work and work Astronomer. Screening  You may have the following tests or measurements: Height, weight, and BMI. Blood pressure. Lipid and cholesterol levels. These may be checked every 5 years, or more frequently if you are over 70 years old. Skin check. Lung cancer screening. You may have this screening every year starting at age 70 if you have a 30-pack-year history of smoking and currently smoke or have quit within the past 15 years. Fecal occult blood test (FOBT) of the stool. You may have this test every year starting at age 70. Flexible sigmoidoscopy or colonoscopy. You may have a sigmoidoscopy every 5 years or a colonoscopy every 10 years starting at age 70. Prostate cancer screening. Recommendations will vary depending on your family history and other risks. Hepatitis C blood test. Hepatitis B blood test. Sexually transmitted disease (STD) testing. Diabetes screening. This is done by checking your blood sugar (glucose) after you have not eaten for a while (fasting). You may have this done every 1-3 years. Abdominal aortic aneurysm (AAA) screening. You may need this if you are a current or former smoker. Osteoporosis. You may be screened starting at age 70 if you are at high risk. Talk with your health care provider about your test results, treatment options, and if necessary, the need for more tests. Vaccines  Your health care provider may recommend certain vaccines, such as: Influenza vaccine. This is recommended every year. Tetanus,  diphtheria, and acellular pertussis (Tdap, Td) vaccine. You may need a Td booster every 10 years. Zoster vaccine. You may need this after age 19. Pneumococcal 13-valent conjugate (PCV13) vaccine. One dose is recommended after age 70. Pneumococcal polysaccharide (PPSV23) vaccine. One dose is recommended after  age 70. Talk to your health care provider about which screenings and vaccines you need and how often you need them. This information is not intended to replace advice given to you by your health care provider. Make sure you discuss any questions you have with your health care provider. Document Released: 05/15/2015 Document Revised: 01/06/2016 Document Reviewed: 02/17/2015 Elsevier Interactive Patient Education  2017 [employer].  Fall Prevention in the Home Falls can cause injuries. They can happen to people of all ages. There are many things you can do to make your home safe and to help prevent falls. What can I do on the outside of my home? Regularly fix the edges of walkways and driveways and fix any cracks. Remove anything that might make you trip as you walk through a door, such as a raised step or threshold. Trim any bushes or trees on the path to your home. Use bright outdoor lighting. Clear any walking paths of anything that might make someone trip, such as rocks or tools. Regularly check to see if handrails are loose or broken. Make sure that both sides of any steps have handrails. Any raised decks and porches should have guardrails on the edges. Have any leaves, snow, or ice cleared regularly. Use sand or salt on walking paths during winter. Clean up any spills in your garage right away. This includes oil or grease spills. What can I do in the bathroom? Use night lights. Install grab bars by the toilet and in the tub and shower. Do not use towel bars as grab bars. Use non-skid mats or decals in the tub or shower. If you need to sit down in the shower, use a plastic, non-slip stool. Keep the floor dry. Clean up any water that spills on the floor as soon as it happens. Remove soap buildup in the tub or shower regularly. Attach bath mats securely with double-sided non-slip rug tape. Do not have throw rugs and other things on the floor that can make you trip. What can I do in the  bedroom? Use night lights. Make sure that you have a light by your bed that is easy to reach. Do not use any sheets or blankets that are too big for your bed. They should not hang down onto the floor. Have a firm chair that has side arms. You can use this for support while you get dressed. Do not have throw rugs and other things on the floor that can make you trip. What can I do in the kitchen? Clean up any spills right away. Avoid walking on wet floors. Keep items that you use a lot in easy-to-reach places. If you need to reach something above you, use a strong step stool that has a grab bar. Keep electrical cords out of the way. Do not use floor polish or wax that makes floors slippery. If you must use wax, use non-skid floor wax. Do not have throw rugs and other things on the floor that can make you trip. What can I do with my stairs? Do not leave any items on the stairs. Make sure that there are handrails on both sides of the stairs and use them. Fix handrails that are broken or loose. Make  sure that handrails are as long as the stairways. Check any carpeting to make sure that it is firmly attached to the stairs. Fix any carpet that is loose or worn. Avoid having throw rugs at the top or bottom of the stairs. If you do have throw rugs, attach them to the floor with carpet tape. Make sure that you have a light switch at the top of the stairs and the bottom of the stairs. If you do not have them, ask someone to add them for you. What else can I do to help prevent falls? Wear shoes that: Do not have high heels. Have rubber bottoms. Are comfortable and fit you well. Are closed at the toe. Do not wear sandals. If you use a stepladder: Make sure that it is fully opened. Do not climb a closed stepladder. Make sure that both sides of the stepladder are locked into place. Ask someone to hold it for you, if possible. Clearly mark and make sure that you can see: Any grab bars or  handrails. First and last steps. Where the edge of each step is. Use tools that help you move around (mobility aids) if they are needed. These include: Canes. Walkers. Scooters. Crutches. Turn on the lights when you go into a dark area. Replace any light bulbs as soon as they burn out. Set up your furniture so you have a clear path. Avoid moving your furniture around. If any of your floors are uneven, fix them. If there are any pets around you, be aware of where they are. Review your medicines with your doctor. Some medicines can make you feel dizzy. This can increase your chance of falling. Ask your doctor what other things that you can do to help prevent falls. This information is not intended to replace advice given to you by your health care provider. Make sure you discuss any questions you have with your health care provider. Document Released: 02/12/2009 Document Revised: 09/24/2015 Document Reviewed: 05/23/2014 Elsevier Interactive Patient Education  2017 [employer].
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
## 2022-11-21 NOTE — Patient Instructions (Addendum)
Take an antibiotic called doxycycline for 1 week for the infection on your back. Change the Band-Aids twice daily  Apply the cream to your left leg twice daily for the next 10 days  We are referring you to a general surgeon  Please be sure you discussed with [facility] the following: Colon cancer screening Prostate cancer screening   Vaccines I recommend: PNM 20 RSV COVID booster Second Shingrix vaccine Flu shot every fall    GO TO THE LAB : Get the blood work     GO TO THE FRONT DESK, PLEASE SCHEDULE YOUR APPOINTMENTS Come back for   checkup in 6 months
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
## 2022-11-22 NOTE — Assessment & Plan Note (Signed)
HTN: BP is very good today, on Imdur, checking labs High cholesterol: Based on last FLP, atorvastatin increased to 80 mg.  Check a FLP AST ALT CAD: Denies symptoms, RF Imdur. Sebaceous cyst, infected: Has a large sebaceous cyst on the upper back, it is currently infected, states this is not the first time is infected.  I removed several days old gauze from the area. Plan: Keep the area clean and dry, change Band-Aids twice daily, doxycycline, refer to surgery for consideration of excisions once the infection is gone. Rash: Pruritic, probably eczema, prescribed hydrocortisone. Preventive care: Gets part of his care at the [place], strongly encouraged him to discuss within appropriate screenings and vaccines.  See AVS. RTC 6 months

## 2022-11-23 MED ORDER — EZETIMIBE 10 MG PO TABS: 10.0000 mg | ORAL_TABLET | Freq: Every day | ORAL | 3 refills | Status: DC

## 2022-11-23 NOTE — Addendum Note (Signed)
Addended byConrad Hendrix D on: 11/23/2022 03:36 PM   Modules accepted: Orders

## 2023-01-06 ENCOUNTER — Encounter: Payer: Self-pay | Admitting: Cardiology

## 2023-01-09 DIAGNOSIS — M79672 Pain in left foot: Secondary | ICD-10-CM | POA: Diagnosis not present

## 2023-01-09 DIAGNOSIS — B351 Tinea unguium: Secondary | ICD-10-CM | POA: Diagnosis not present

## 2023-01-09 DIAGNOSIS — L84 Corns and callosities: Secondary | ICD-10-CM | POA: Diagnosis not present

## 2023-01-09 DIAGNOSIS — M79671 Pain in right foot: Secondary | ICD-10-CM | POA: Diagnosis not present

## 2023-01-10 ENCOUNTER — Ambulatory Visit: Payer: Medicare Other | Admitting: Cardiology

## 2023-02-14 IMAGING — CT CT CHEST W/O CM
2 of 3 series · 15 of 36 positions shown, 18 images · non-contrast
Comparison: Plain film from 09/20/2019, CT from 11/23/2003

CLINICAL DATA: Right-sided chest pain for several months

EXAM:
CT CHEST WITHOUT CONTRAST
TECHNIQUE: Multidetector CT imaging of the chest was performed following the
standard protocol without IV contrast.

[Series 2: thorax · axial · 0.80mm/px · z∈[-322,-36]mm · 12 of 169 slices shown, 15 images]
[im 13/169  mediastinal]
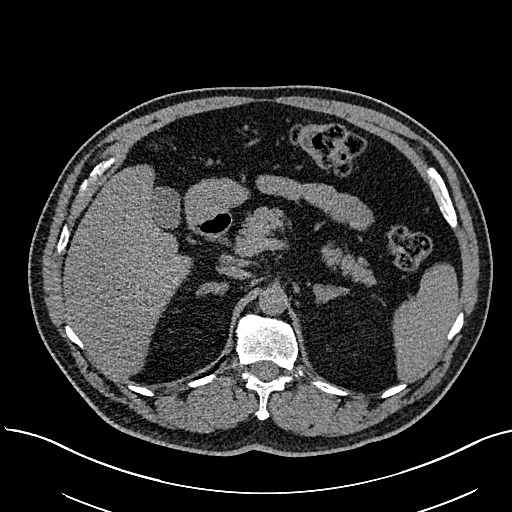
[im 13/169  lung]
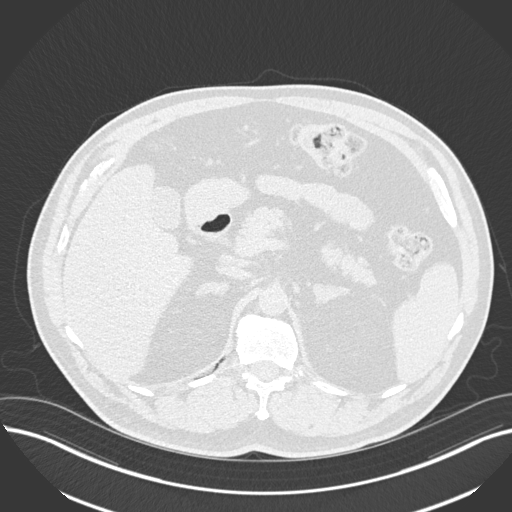
[im 25/169  lung]
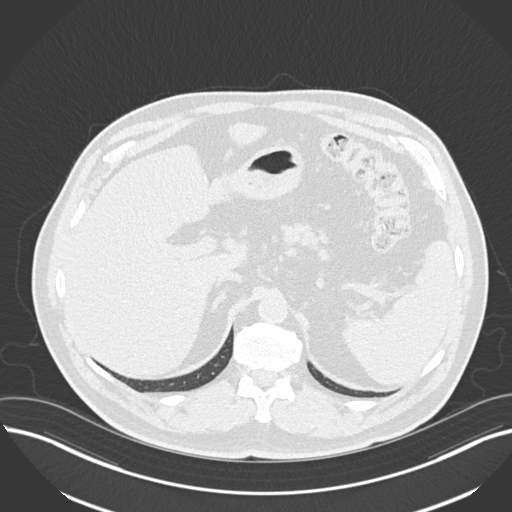
[im 38/169  lung]
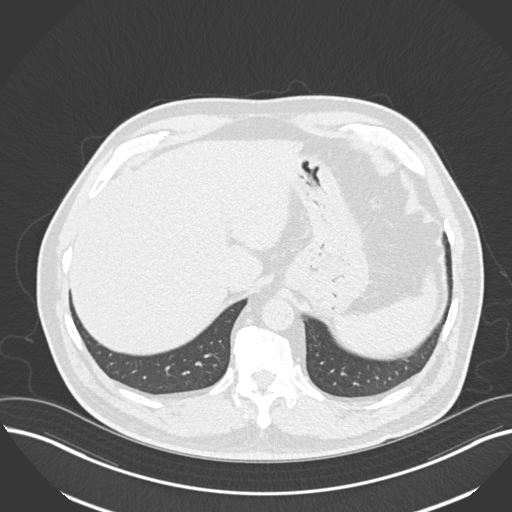
[im 50/169  lung]
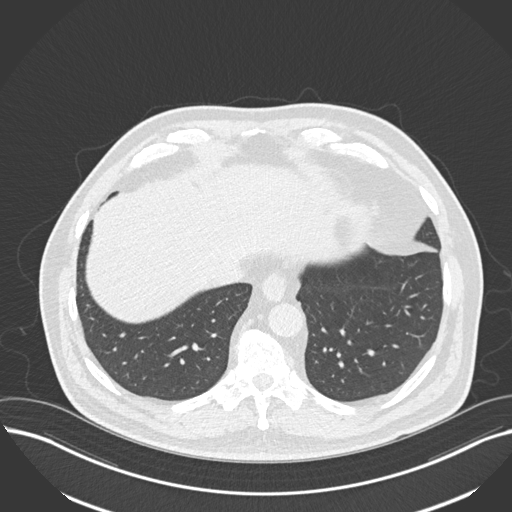
[im 63/169  mediastinal]
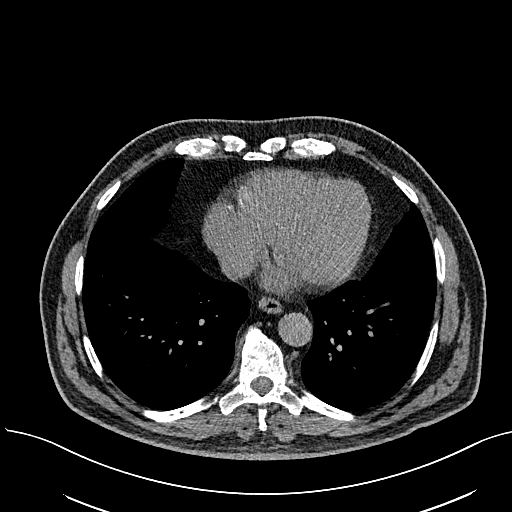
[im 63/169  lung]
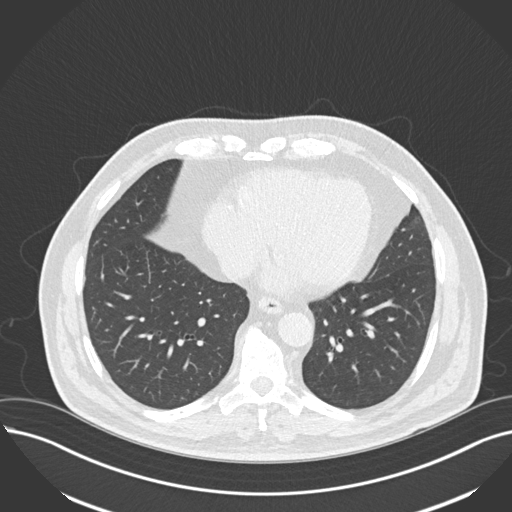
[im 75/169  lung]
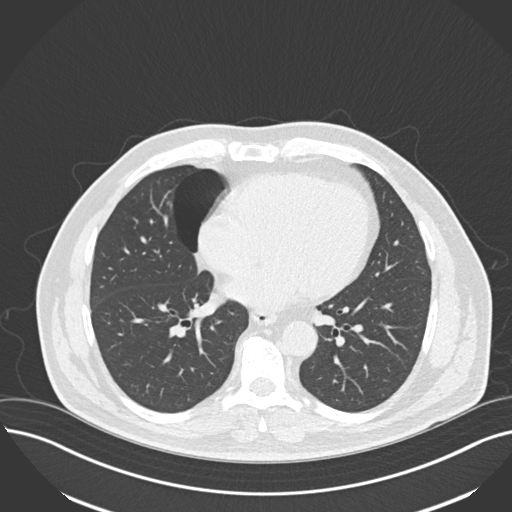
[im 94/169  lung]
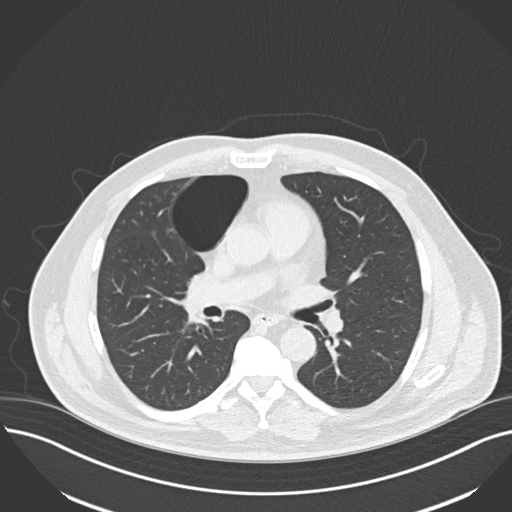
[im 106/169  lung]
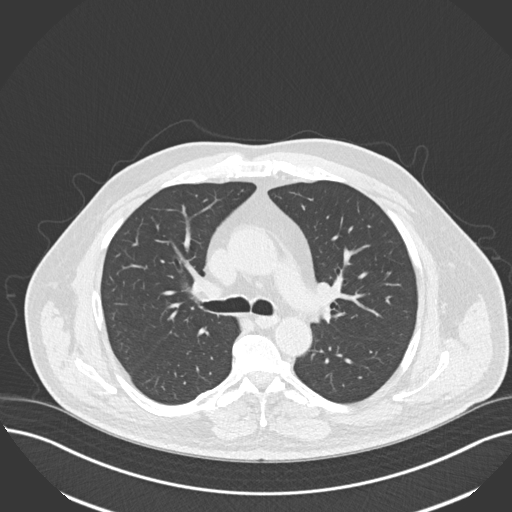
[im 119/169  mediastinal]
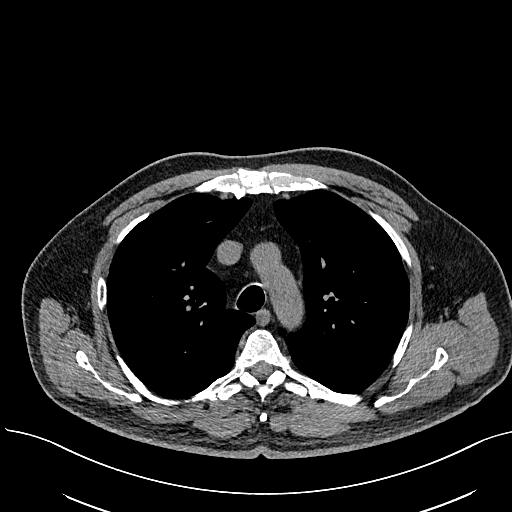
[im 119/169  lung]
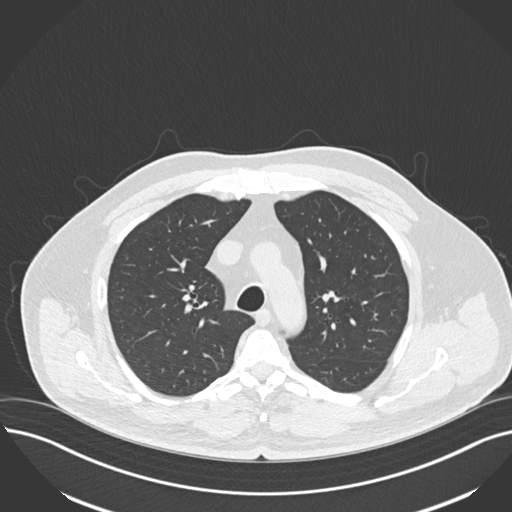
[im 131/169  lung]
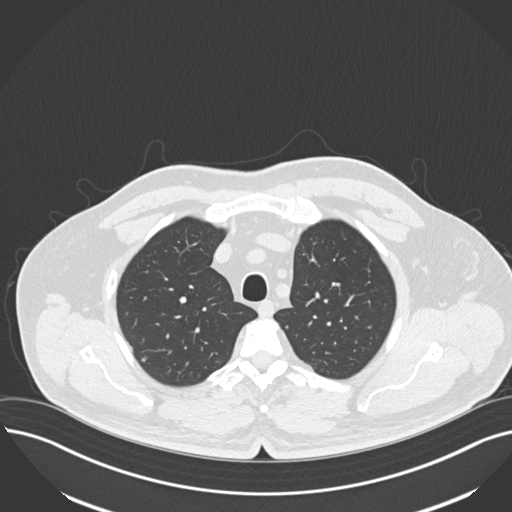
[im 144/169  lung]
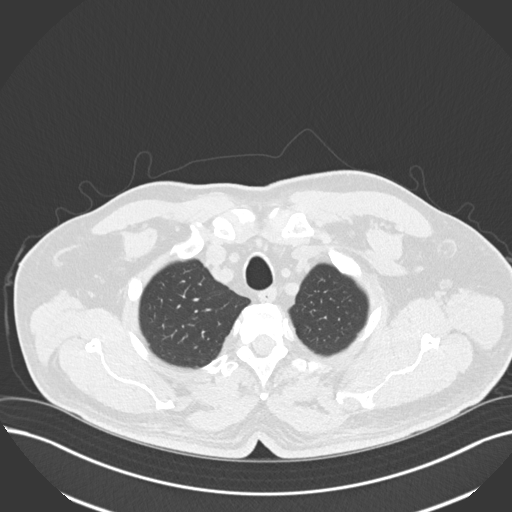
[im 156/169  lung]
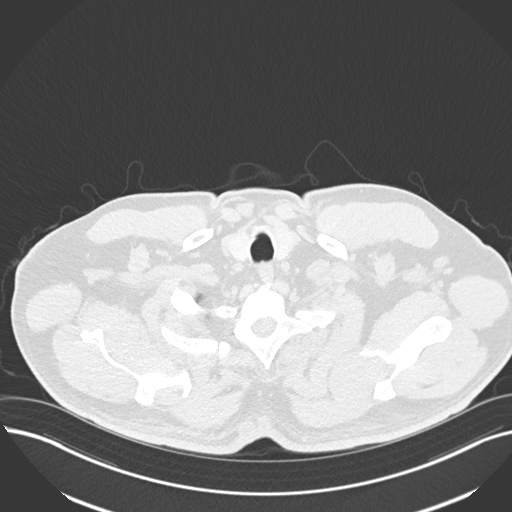

[Series 5: coronal · coronal · 0.69mm/px · 3 of 147 slices shown]
[im 30/147  lung]
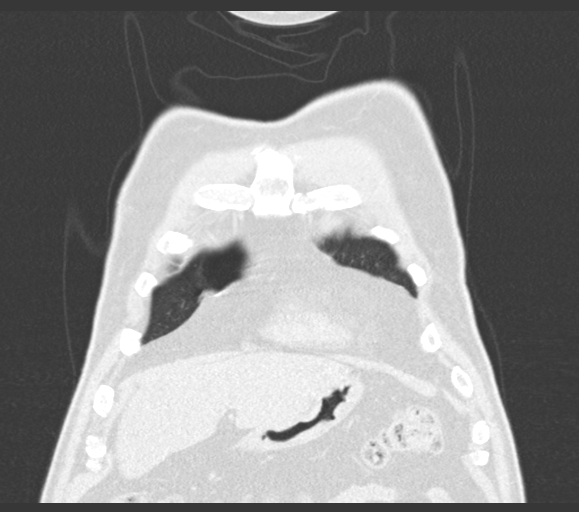
[im 59/147  lung]
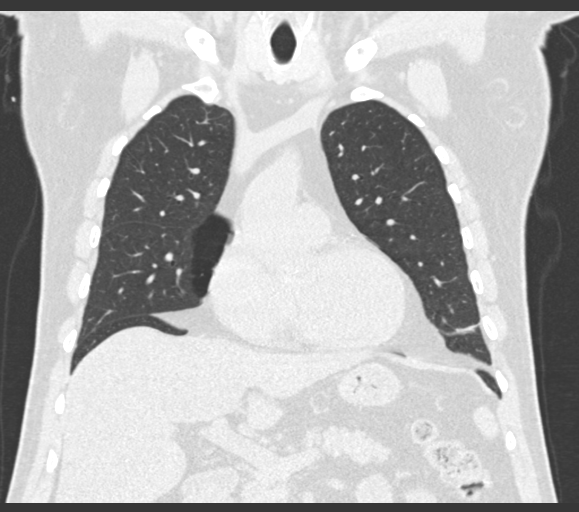
[im 88/147  lung]
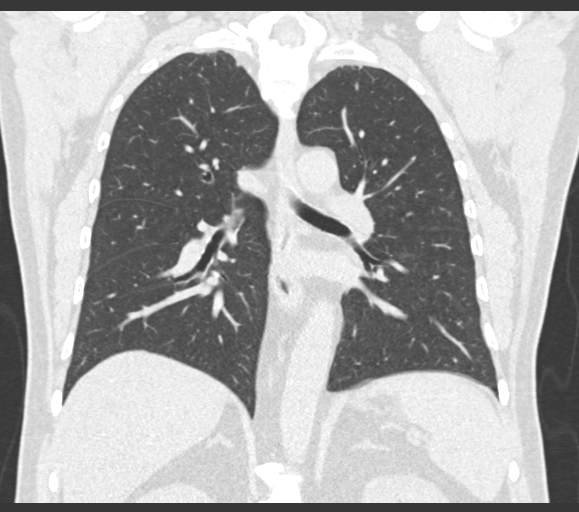

[15 of 36 positions shown; findings below may reference images not displayed]

FINDINGS: Cardiovascular: Somewhat limited due to lack of IV contrast.
Coronary calcifications are seen. No cardiac enlargement is noted.
No aneurysmal dilatation of the aorta is seen. Mild aortic
calcifications are noted.

Mediastinum/Nodes: Thoracic inlet is within normal limits. No
sizable hilar or mediastinal adenopathy is noted. The esophagus as
visualized is within normal limits.

Lungs/Pleura: Large right middle lobe bulla stable in appearance
from the prior exam. No focal infiltrate or sizable effusion is
noted. Tiny less than 5 mm nodule is noted in the right upper lobe
laterally best seen on image number 40 of series 3. This was not
well appreciated on the prior CT. No other nodules are noted.

Upper Abdomen: Visualized upper abdomen is within normal limits.

Musculoskeletal: Degenerative changes of the thoracic spine are
noted. No acute bony abnormality is noted.
IMPRESSION: Less than 5 mm nodule in the right upper lobe. No follow-up needed
if patient is low-risk. Non-contrast chest CT can be considered in
12 months if patient is high-risk. This recommendation follows the
consensus statement: Guidelines for Management of Incidental
Pulmonary Nodules Detected on CT Images: From the [HOSPITAL]

Stable bulla in the right middle lobe.

Aortic Atherosclerosis (206EC-J42.2) and Emphysema (206EC-0EV.8).

## 2023-03-22 ENCOUNTER — Telehealth: Payer: Self-pay | Admitting: Internal Medicine

## 2023-03-22 NOTE — Telephone Encounter (Signed)
Pt needs appt

## 2023-03-22 NOTE — Telephone Encounter (Signed)
Pt's friend, Elease Hashimoto, would like to know if we can send in the same antibiotics from July for his cyst. States he has a surgery consultation coming up that has been r/s a lot and she would like to get this cleared up first. Please advise pt if he needs an appt.

## 2023-03-23 NOTE — Telephone Encounter (Signed)
lvm

## 2023-04-28 ENCOUNTER — Ambulatory Visit: Payer: Medicare Other | Attending: Cardiology | Admitting: Cardiology

## 2023-04-28 ENCOUNTER — Encounter: Payer: Self-pay | Admitting: Cardiology

## 2023-04-28 VITALS — BP 134/78 | HR 60 | Ht 70.0 in | Wt 192.0 lb

## 2023-04-28 DIAGNOSIS — E782 Mixed hyperlipidemia: Secondary | ICD-10-CM | POA: Diagnosis not present

## 2023-04-28 DIAGNOSIS — I25118 Atherosclerotic heart disease of native coronary artery with other forms of angina pectoris: Secondary | ICD-10-CM | POA: Diagnosis not present

## 2023-04-28 DIAGNOSIS — I10 Essential (primary) hypertension: Secondary | ICD-10-CM | POA: Diagnosis not present

## 2023-04-28 DIAGNOSIS — R0789 Other chest pain: Secondary | ICD-10-CM | POA: Diagnosis not present
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
## 2023-04-28 NOTE — Patient Instructions (Addendum)
Medication Instructions:  Your physician recommends that you continue on your current medications as directed. Please refer to the Current Medication list given to you today.  *If you need a refill on your cardiac medications before your next appointment, please call your pharmacy*   Lab Work: 3rd Floor   [address]  Your physician recommends that you return for lab work in:  when fasting You need to have labs done when you are fasting.  You can come Monday through Friday 8:00 am to 11:30AM and 1:00 to 4:00. You do not need to make an appointment as the order has already been placed.     Testing/Procedures: None Ordered   Follow-Up: At [facility], you and your health needs are our priority.  As part of our continuing mission to provide you with exceptional heart care, we have created designated Provider Care Teams.  These Care Teams include your primary Cardiologist (physician) and Advanced Practice Providers (APPs -  Physician Assistants and Nurse Practitioners) who all work together to provide you with the care you need, when you need it.  We recommend signing up for the patient portal called "MyChart".  Sign up information is provided on this After Visit Summary.  MyChart is used to connect with patients for Virtual Visits (Telemedicine).  Patients are able to view lab/test results, encounter notes, upcoming appointments, etc.  Non-urgent messages can be sent to your provider as well.   To learn more about what you can do with MyChart, go to [email or web address].    Your next appointment:   6 month(s)  The format for your next appointment:   In Person  Provider:   Gypsy Balsam, MD    Other Instructions NA

## 2023-04-28 NOTE — Addendum Note (Signed)
Addended by: Baldo Ash D on: 04/28/2023 09:50 AM   Modules accepted: Orders

## 2023-04-29 LAB — LIPID PANEL
Chol/HDL Ratio: 5.3 {ratio} — ABNORMAL HIGH (ref 0.0–5.0)
Cholesterol, Total: 170 mg/dL (ref 100–199)
HDL: 32 mg/dL — ABNORMAL LOW (ref >39)
LDL Chol Calc (NIH): 103 mg/dL — ABNORMAL HIGH (ref 0–99)
Triglycerides: 198 mg/dL — ABNORMAL HIGH (ref 0–149)
VLDL Cholesterol Cal: 35 mg/dL (ref 5–40)

## 2023-04-29 LAB — ALT: ALT: 21 [IU]/L (ref 0–44)

## 2023-04-29 LAB — AST: AST: 22 [IU]/L (ref 0–40)

## 2023-05-02 ENCOUNTER — Encounter: Payer: Self-pay | Admitting: Cardiology

## 2023-05-02 ENCOUNTER — Other Ambulatory Visit: Payer: Self-pay | Admitting: Cardiology

## 2023-05-02 ENCOUNTER — Other Ambulatory Visit: Payer: Self-pay

## 2023-05-02 MED ORDER — RANOLAZINE ER 500 MG PO TB12: 500.0000 mg | ORAL_TABLET | Freq: Two times a day (BID) | ORAL | 3 refills | Status: AC

## 2023-05-02 NOTE — Telephone Encounter (Signed)
Ranolazine 500 1 Bid #180 ref x 3 sent to [facility]

## 2023-05-14 ENCOUNTER — Other Ambulatory Visit: Payer: Self-pay | Admitting: Internal Medicine

## 2023-05-25 ENCOUNTER — Telehealth: Payer: Self-pay

## 2023-05-25 NOTE — Telephone Encounter (Signed)
LMOM asking for call back to reschedule appt for 05/26/23- provider out sick.

## 2023-05-26 ENCOUNTER — Ambulatory Visit: Payer: Medicare Other | Admitting: Internal Medicine

## 2023-06-06 ENCOUNTER — Ambulatory Visit: Payer: Medicare Other | Admitting: Internal Medicine

## 2023-06-30 DIAGNOSIS — M79672 Pain in left foot: Secondary | ICD-10-CM | POA: Diagnosis not present

## 2023-06-30 DIAGNOSIS — M79671 Pain in right foot: Secondary | ICD-10-CM | POA: Diagnosis not present

## 2023-06-30 DIAGNOSIS — B351 Tinea unguium: Secondary | ICD-10-CM | POA: Diagnosis not present

## 2023-07-11 ENCOUNTER — Ambulatory Visit (INDEPENDENT_AMBULATORY_CARE_PROVIDER_SITE_OTHER): Payer: Medicare Other | Admitting: Internal Medicine

## 2023-07-11 ENCOUNTER — Encounter: Payer: Self-pay | Admitting: Internal Medicine

## 2023-07-11 VITALS — BP 114/60 | HR 58 | Temp 98.2°F | Resp 16 | Ht 70.0 in | Wt 193.1 lb

## 2023-07-11 DIAGNOSIS — E782 Mixed hyperlipidemia: Secondary | ICD-10-CM

## 2023-07-11 DIAGNOSIS — R21 Rash and other nonspecific skin eruption: Secondary | ICD-10-CM | POA: Diagnosis not present

## 2023-07-11 DIAGNOSIS — Z23 Encounter for immunization: Secondary | ICD-10-CM

## 2023-07-11 DIAGNOSIS — I10 Essential (primary) hypertension: Secondary | ICD-10-CM | POA: Diagnosis not present

## 2023-07-11 DIAGNOSIS — R739 Hyperglycemia, unspecified: Secondary | ICD-10-CM | POA: Diagnosis not present

## 2023-07-11 LAB — CBC WITH DIFFERENTIAL/PLATELET
Basophils Absolute: 0.1 10*3/uL (ref 0.0–0.1)
Basophils Relative: 1 % (ref 0.0–3.0)
Eosinophils Absolute: 0.2 10*3/uL (ref 0.0–0.7)
Eosinophils Relative: 3.4 % (ref 0.0–5.0)
HCT: 42.2 % (ref 39.0–52.0)
Hemoglobin: 14.2 g/dL (ref 13.0–17.0)
Lymphocytes Relative: 22.5 % (ref 12.0–46.0)
Lymphs Abs: 1.2 10*3/uL (ref 0.7–4.0)
MCHC: 33.6 g/dL (ref 30.0–36.0)
MCV: 93.3 fl (ref 78.0–100.0)
Monocytes Absolute: 0.5 10*3/uL (ref 0.1–1.0)
Monocytes Relative: 9 % (ref 3.0–12.0)
Neutro Abs: 3.5 10*3/uL (ref 1.4–7.7)
Neutrophils Relative %: 64.1 % (ref 43.0–77.0)
Platelets: 150 10*3/uL (ref 150.0–400.0)
RBC: 4.53 Mil/uL (ref 4.22–5.81)
RDW: 13.5 % (ref 11.5–15.5)
WBC: 5.4 10*3/uL (ref 4.0–10.5)

## 2023-07-11 LAB — BASIC METABOLIC PANEL
BUN: 27 mg/dL — ABNORMAL HIGH (ref 6–23)
CO2: 30 meq/L (ref 19–32)
Calcium: 9.4 mg/dL (ref 8.4–10.5)
Chloride: 106 meq/L (ref 96–112)
Creatinine, Ser: 0.95 mg/dL (ref 0.40–1.50)
GFR: 80.98 mL/min (ref >60.00)
Glucose, Bld: 110 mg/dL — ABNORMAL HIGH (ref 70–99)
Potassium: 3.9 meq/L (ref 3.5–5.1)
Sodium: 142 meq/L (ref 135–145)

## 2023-07-11 LAB — HEMOGLOBIN A1C: Hgb A1c MFr Bld: 6 % (ref 4.6–6.5)

## 2023-07-11 MED ORDER — BETAMETHASONE DIPROPIONATE AUG 0.05 % EX CREA: TOPICAL_CREAM | Freq: Two times a day (BID) | CUTANEOUS | 0 refills | Status: DC

## 2023-07-11 NOTE — Assessment & Plan Note (Signed)
 Prediabetes: Check A1c. HTN: BP looks very good, recommend to check at home, continue Imdur, check BMP CAD: [facility] cardiology December 2024, rec to continue aspirin, Imdur, ranolazine.  Denies symptoms. High cholesterol: Last LDL 103 (December 2024), cardiology rec to switch Zetia to Nexlizet.  I do not believe pt made the change, recommend to reach out to cardiology. Rash: Still suspect a partially treated eczema (see LOV) stop hydrocortisone 2%, trial with Diprolene twice daily for 10 days.  If no better he will call for a dermatology referral. Vaccines: No recent vaccines  from the [place] per patient, request a flu shot and PNM.  Will do RTC 4 months

## 2023-07-11 NOTE — Patient Instructions (Addendum)
 Reach out to cardiology regards your cholesterol treatment  Stop the hydrocortisone cream, start betamethasone twice daily for 10 days. If the rash is not better let me know, we will refer you to dermatology.  Check the  blood pressure regularly Blood pressure goal:  between 110/65 and  135/85. If it is consistently higher or lower, let me know     GO TO THE LAB : Get the blood work     Please go to the front desk: Arrange for follow-up in 4 months.
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
## 2023-07-13 ENCOUNTER — Encounter: Payer: Self-pay | Admitting: Internal Medicine

## 2023-07-13 DIAGNOSIS — K635 Polyp of colon: Secondary | ICD-10-CM

## 2023-07-19 NOTE — Telephone Encounter (Signed)
 Saw GI November 2022, they recommended a follow-up colonoscopy 2023.

## 2023-08-27 ENCOUNTER — Other Ambulatory Visit: Payer: Self-pay | Admitting: Internal Medicine

## 2023-10-03 ENCOUNTER — Other Ambulatory Visit: Payer: Self-pay | Admitting: Internal Medicine

## 2023-10-20 DIAGNOSIS — B351 Tinea unguium: Secondary | ICD-10-CM | POA: Diagnosis not present

## 2023-10-20 DIAGNOSIS — M79672 Pain in left foot: Secondary | ICD-10-CM | POA: Diagnosis not present

## 2023-10-20 DIAGNOSIS — M79671 Pain in right foot: Secondary | ICD-10-CM | POA: Diagnosis not present

## 2023-10-27 ENCOUNTER — Ambulatory Visit (INDEPENDENT_AMBULATORY_CARE_PROVIDER_SITE_OTHER): Admitting: Internal Medicine

## 2023-10-27 VITALS — BP 138/84 | HR 81 | Temp 98.2°F | Resp 18 | Ht 70.0 in | Wt 181.2 lb

## 2023-10-27 DIAGNOSIS — M5126 Other intervertebral disc displacement, lumbar region: Secondary | ICD-10-CM

## 2023-10-27 DIAGNOSIS — M5416 Radiculopathy, lumbar region: Secondary | ICD-10-CM | POA: Diagnosis not present

## 2023-10-27 MED ORDER — HYDROCODONE-ACETAMINOPHEN 5-325 MG PO TABS: 1.0000 | ORAL_TABLET | Freq: Three times a day (TID) | ORAL | 0 refills | Status: AC | PRN

## 2023-10-27 MED ORDER — PREDNISONE 10 MG PO TABS: ORAL_TABLET | ORAL | 0 refills | Status: AC

## 2023-10-27 NOTE — Patient Instructions (Signed)
 I think you have a herniated disc on your back.  Take prednisone  for few days, as prescribed.  You can take pain medication 3 times a day: If the pain is mild to moderate: Tylenol  500 mg: 2 tablets If the pain is moderate to severe: Take hydrocodone , 1 or 2 tablets.  Rest  Go to the ER if: Pain is severe, your left leg become weak or you lose the feeling there.  Also if you have difficulty controlling your bladder or bowels.  We are referring you urgently to orthopedics. If they cannot see you in the next few days please let me know
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
## 2023-10-28 NOTE — Assessment & Plan Note (Signed)
 Radiculopathy: Acute pain as described above, suspect L4 or L5 radiculopathy. Plan:  Prednisone , pain control with Tylenol  and hydrocodone .  Risk of hydrocodone  include habituation, drowsiness, falls.   He is willing to accept. Urgent referral to Ortho. ER if severe symptoms, motor deficits.  See AVS.

## 2023-10-30 ENCOUNTER — Telehealth: Payer: Self-pay | Admitting: Internal Medicine

## 2023-10-30 NOTE — Telephone Encounter (Signed)
 Can you update referral to   [facility] - [facility] [address], [place], [place] [address] T: [phone]

## 2023-10-30 NOTE — Telephone Encounter (Signed)
 Copied from CRM [id number]. Topic: Referral - Question >> Oct 30, 2023 10:48 AM Chiquita SQUIBB wrote: Reason for CRM: Patient is asking if the Ortho referral  [facility] on [address] the doctor is Dr. JONELLE Ada.

## 2023-11-08 ENCOUNTER — Encounter: Payer: Self-pay | Admitting: Internal Medicine

## 2023-11-13 ENCOUNTER — Ambulatory Visit (INDEPENDENT_AMBULATORY_CARE_PROVIDER_SITE_OTHER): Admitting: Internal Medicine

## 2023-11-13 ENCOUNTER — Encounter: Payer: Self-pay | Admitting: Internal Medicine

## 2023-11-13 VITALS — BP 130/70 | HR 71 | Temp 97.9°F | Resp 18 | Ht 70.0 in | Wt 187.1 lb

## 2023-11-13 DIAGNOSIS — R972 Elevated prostate specific antigen [PSA]: Secondary | ICD-10-CM | POA: Diagnosis not present

## 2023-11-13 DIAGNOSIS — M5416 Radiculopathy, lumbar region: Secondary | ICD-10-CM

## 2023-11-13 DIAGNOSIS — E782 Mixed hyperlipidemia: Secondary | ICD-10-CM

## 2023-11-13 DIAGNOSIS — I10 Essential (primary) hypertension: Secondary | ICD-10-CM | POA: Diagnosis not present

## 2023-11-13 LAB — BASIC METABOLIC PANEL WITH GFR
BUN: 25 mg/dL — ABNORMAL HIGH (ref 6–23)
CO2: 29 meq/L (ref 19–32)
Calcium: 9.7 mg/dL (ref 8.4–10.5)
Chloride: 102 meq/L (ref 96–112)
Creatinine, Ser: 0.97 mg/dL (ref 0.40–1.50)
GFR: 78.79 mL/min (ref >60.00)
Glucose, Bld: 108 mg/dL — ABNORMAL HIGH (ref 70–99)
Potassium: 4.3 meq/L (ref 3.5–5.1)
Sodium: 139 meq/L (ref 135–145)

## 2023-11-13 LAB — LIPID PANEL
Cholesterol: 236 mg/dL — ABNORMAL HIGH (ref 0–200)
HDL: 36.2 mg/dL — ABNORMAL LOW (ref >39.00)
NonHDL: 200.13
Total CHOL/HDL Ratio: 7
Triglycerides: 528 mg/dL — ABNORMAL HIGH (ref 0.0–149.0)
VLDL: 105.6 mg/dL — ABNORMAL HIGH (ref 0.0–40.0)

## 2023-11-13 LAB — PSA: PSA: 0.89 ng/mL (ref 0.10–4.00)

## 2023-11-13 LAB — AST: AST: 34 U/L (ref 0–37)

## 2023-11-13 LAB — ALT: ALT: 48 U/L (ref 0–53)

## 2023-11-13 LAB — LDL CHOLESTEROL, DIRECT: Direct LDL: 99 mg/dL
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
## 2023-11-13 NOTE — Assessment & Plan Note (Signed)
 Radiculopathy: See LOV, pain decreased, continue with numbness, still have a questionable left leg weakness unclear if related to pain.  Today he is walking without assistance. Patient changed the Ortho referral to somebody closer to home and will see Ortho 11/24/2023. Due to questionable weakness we will try to get him a sooner appointment.  To present to the ER if he does perceive worsening motor function in the left leg. HTN: BP looks good, on Imdur .  Check BMP High cholesterol:Based on last FLP, cardiology rec nexlizet, I do not see that ever was started.  Check labs CAD: Asymptomatic. Preventive care reviewed  RTC 3 months

## 2023-11-13 NOTE — Patient Instructions (Addendum)
 Please reach out to the orthopedic office. You need to be sooner than July 25. You can reach at [phone]  If you have increased pain or you notice left leg weakness: Go to the ER  Vaccines are recommended: Shingles shot Tetanus shot (Tdap) Flu shot every fall A COVID booster     GO TO THE LAB :  Get the blood work   Your results will be posted on MyChart with my comments  Next office visit for a checkup in 3 months Please make an appointment before you leave today

## 2023-11-13 NOTE — Assessment & Plan Note (Signed)
 Preventive care reviewed  Vaccines I recommended: Shingrix No. 2, Tdap, COVID booster, flu shot every fall. CCS: Colonoscopy at the [facility] approximately 06/13/2016.Path: Colon polyps, multiple: Hyperplastic, tubular adenoma, hyperplastic, benign colonic mucosa. Next 2022.  Currently dealing with radiculopathy, thus we agreed to reassess in few months Increased PSA/BPH: Prostate Bx 2020 >>  simple prostatectomy 07/2020, no cancer, last [facility] 2023, PSA 0.1 at the time.  They rec prostate cancer screening until age 40.  No symptoms.  Check PSA. RTC 3 months

## 2023-11-14 ENCOUNTER — Telehealth: Payer: Self-pay | Admitting: Internal Medicine

## 2023-11-14 ENCOUNTER — Ambulatory Visit: Payer: Self-pay | Admitting: Internal Medicine

## 2023-11-14 DIAGNOSIS — E782 Mixed hyperlipidemia: Secondary | ICD-10-CM

## 2023-11-14 MED ORDER — BEMPEDOIC ACID-EZETIMIBE 180-10 MG PO TABS: 1.0000 | ORAL_TABLET | Freq: Every day | ORAL | 6 refills | Status: AC

## 2023-11-14 NOTE — Telephone Encounter (Signed)
 Spoke w/ Pt- informed of results and recommendations. Pt verbalized understanding. Lab appt scheduled.   Dr Amon- I'm unable to find Nexilet in Epic. Please advise.

## 2023-11-14 NOTE — Telephone Encounter (Signed)
 Call patient. Triglycerides very high, recommend to watch diet closely. Cholesterol higher than before. Plan: Stop Zetia , start  Nexilet (bempedoic acid , ezetimibe  180-10 mg: 1 tablet daily, send prescription.)  Fasting FLP AST ALT in 6 weeks. Other results look good

## 2023-11-14 NOTE — Telephone Encounter (Signed)
Rx sent,

## 2023-11-15 ENCOUNTER — Other Ambulatory Visit (HOSPITAL_COMMUNITY): Payer: Self-pay

## 2023-11-15 NOTE — Telephone Encounter (Signed)
Needs PA please.

## 2023-11-22 ENCOUNTER — Ambulatory Visit (INDEPENDENT_AMBULATORY_CARE_PROVIDER_SITE_OTHER)

## 2023-11-22 VITALS — BP 130/70 | Ht 70.0 in | Wt 186.0 lb

## 2023-11-22 DIAGNOSIS — Z0000 Encounter for general adult medical examination without abnormal findings: Secondary | ICD-10-CM | POA: Diagnosis not present

## 2023-11-22 DIAGNOSIS — Z2821 Immunization not carried out because of patient refusal: Secondary | ICD-10-CM

## 2023-11-22 NOTE — Patient Instructions (Signed)
 [name] , Thank you for taking time out of your busy schedule to complete your Annual Wellness Visit with me. I enjoyed our conversation and look forward to speaking with you again next year. I, as well as your care team,  appreciate your ongoing commitment to your health goals. Please review the following plan we discussed and let me know if I can assist you in the future. Your Game plan/ To Do List    Referrals: If you haven't heard from the office you've been referred to, please reach out to them at the phone provided.  none Follow up Visits: Next Medicare AWV with our clinical staff: 11/28/2024   Have you seen your provider in the last 6 months (3 months if uncontrolled diabetes)? No Next Office Visit with your provider: 12/26/2023  Clinician Recommendations:  Aim for 30 minutes of exercise or brisk walking, 6-8 glasses of water, and 5 servings of fruits and vegetables each day.       This is a list of the screening recommended for you and due dates:  Health Maintenance  Topic Date Due   Zoster (Shingles) Vaccine (2 of 2) 08/08/2018   DTaP/Tdap/Td vaccine (2 - Td or Tdap) 10/31/2023   Colon Cancer Screening  05/15/2024*   Flu Shot  12/01/2023   Medicare Annual Wellness Visit  11/21/2024   Pneumococcal Vaccine for age over 24  Completed   Hepatitis C Screening  Completed   Hepatitis B Vaccine  Aged Out   HPV Vaccine  Aged Out   Meningitis B Vaccine  Aged Out   COVID-19 Vaccine  Discontinued  *Topic was postponed. The date shown is not the original due date.    Advanced directives: (Declined) Advance directive discussed with you today. Even though you declined this today, please call our office should you change your mind, and we can give you the proper paperwork for you to fill out. Advance Care Planning is important because it:  [x]  Makes sure you receive the medical care that is consistent with your values, goals, and preferences  [x]  It provides guidance to your  family and loved ones and reduces their decisional burden about whether or not they are making the right decisions based on your wishes.  Follow the link provided in your after visit summary or read over the paperwork we have mailed to you to help you started getting your Advance Directives in place. If you need assistance in completing these, please reach out to us  so that we can help you!  See attachments for Preventive Care and Fall Prevention Tips.
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
## 2023-11-22 NOTE — Progress Notes (Signed)
I have reviewed and agree with [facility] documentation.  Kathlene November, MD

## 2023-11-24 DIAGNOSIS — M5416 Radiculopathy, lumbar region: Secondary | ICD-10-CM | POA: Diagnosis not present

## 2023-11-26 ENCOUNTER — Other Ambulatory Visit: Payer: Self-pay | Admitting: Internal Medicine

## 2023-11-27 ENCOUNTER — Encounter: Payer: Self-pay | Admitting: Internal Medicine

## 2023-11-28 ENCOUNTER — Other Ambulatory Visit (HOSPITAL_COMMUNITY): Payer: Self-pay

## 2023-11-28 ENCOUNTER — Telehealth: Payer: Self-pay

## 2023-11-28 NOTE — Telephone Encounter (Signed)
 Clinical questions answered and PA submitted.

## 2023-11-28 NOTE — Telephone Encounter (Signed)
 Pharmacy Patient Advocate Encounter  Received notification from [facility] that Prior Authorization for Nexlizet 180-10mg  tabs  has been APPROVED from 11/28/2023 to further notice. Ran test claim, Copay is $4.80. This test claim was processed through [facility]- copay amounts may vary at other pharmacies due to pharmacy/plan contracts, or as the patient moves through the different stages of their insurance plan.   PA #/Case ID/Reference #: [id number]

## 2023-11-28 NOTE — Telephone Encounter (Signed)
 Pharmacy Patient Advocate Encounter   Received notification from RX Request Messages that prior authorization for Nexlizet 180-10mg  tabs is required/requested.   Insurance verification completed.   The patient is insured through [facility] .   Per test claim: PA required; PA started via CoverMyMeds. KEY [id number] . Waiting for clinical questions to populate.

## 2023-11-28 NOTE — Telephone Encounter (Signed)
 Spoke w/ [facility]- made them aware of PA approval.

## 2023-12-04 DIAGNOSIS — R2689 Other abnormalities of gait and mobility: Secondary | ICD-10-CM | POA: Diagnosis not present

## 2023-12-04 DIAGNOSIS — M5416 Radiculopathy, lumbar region: Secondary | ICD-10-CM | POA: Diagnosis not present

## 2023-12-04 DIAGNOSIS — R29898 Other symptoms and signs involving the musculoskeletal system: Secondary | ICD-10-CM | POA: Diagnosis not present

## 2023-12-04 DIAGNOSIS — M5386 Other specified dorsopathies, lumbar region: Secondary | ICD-10-CM | POA: Diagnosis not present

## 2023-12-14 DIAGNOSIS — M5386 Other specified dorsopathies, lumbar region: Secondary | ICD-10-CM | POA: Diagnosis not present

## 2023-12-14 DIAGNOSIS — R2689 Other abnormalities of gait and mobility: Secondary | ICD-10-CM | POA: Diagnosis not present

## 2023-12-14 DIAGNOSIS — M5416 Radiculopathy, lumbar region: Secondary | ICD-10-CM | POA: Diagnosis not present

## 2023-12-14 DIAGNOSIS — R29898 Other symptoms and signs involving the musculoskeletal system: Secondary | ICD-10-CM | POA: Diagnosis not present

## 2023-12-18 DIAGNOSIS — M5416 Radiculopathy, lumbar region: Secondary | ICD-10-CM | POA: Diagnosis not present

## 2023-12-18 DIAGNOSIS — R29898 Other symptoms and signs involving the musculoskeletal system: Secondary | ICD-10-CM | POA: Diagnosis not present

## 2023-12-18 DIAGNOSIS — M5386 Other specified dorsopathies, lumbar region: Secondary | ICD-10-CM | POA: Diagnosis not present

## 2023-12-18 DIAGNOSIS — R2689 Other abnormalities of gait and mobility: Secondary | ICD-10-CM | POA: Diagnosis not present

## 2023-12-21 DIAGNOSIS — M5386 Other specified dorsopathies, lumbar region: Secondary | ICD-10-CM | POA: Diagnosis not present

## 2023-12-21 DIAGNOSIS — M5416 Radiculopathy, lumbar region: Secondary | ICD-10-CM | POA: Diagnosis not present

## 2023-12-21 DIAGNOSIS — R29898 Other symptoms and signs involving the musculoskeletal system: Secondary | ICD-10-CM | POA: Diagnosis not present

## 2023-12-21 DIAGNOSIS — R2689 Other abnormalities of gait and mobility: Secondary | ICD-10-CM | POA: Diagnosis not present

## 2023-12-22 ENCOUNTER — Telehealth: Payer: Self-pay

## 2023-12-22 NOTE — Telephone Encounter (Signed)
 Copied from CRM [id number]. Topic: Appointments - Appointment Scheduling >> Dec 22, 2023  8:38 AM Pinkey ORN wrote: Patient/patient representative is calling to schedule an appointment. Refer to attachments for appointment information. >> Dec 22, 2023  8:40 AM Pinkey ORN wrote: Patient is wanting to know rather or not he needs a follow up appointment with his upcoming lab appointment.

## 2023-12-22 NOTE — Telephone Encounter (Signed)
 Spoke w/ Pt's friend Avelina (on [place])- informed Pt does not need another appt w/ PCP until October, appt scheduled. Pt to keep lab appt for next week.

## 2023-12-26 ENCOUNTER — Other Ambulatory Visit

## 2024-01-02 DIAGNOSIS — M5386 Other specified dorsopathies, lumbar region: Secondary | ICD-10-CM | POA: Diagnosis not present

## 2024-01-02 DIAGNOSIS — R2689 Other abnormalities of gait and mobility: Secondary | ICD-10-CM | POA: Diagnosis not present

## 2024-01-02 DIAGNOSIS — M5416 Radiculopathy, lumbar region: Secondary | ICD-10-CM | POA: Diagnosis not present

## 2024-01-02 DIAGNOSIS — R29898 Other symptoms and signs involving the musculoskeletal system: Secondary | ICD-10-CM | POA: Diagnosis not present

## 2024-01-04 DIAGNOSIS — M5386 Other specified dorsopathies, lumbar region: Secondary | ICD-10-CM | POA: Diagnosis not present

## 2024-01-04 DIAGNOSIS — R2689 Other abnormalities of gait and mobility: Secondary | ICD-10-CM | POA: Diagnosis not present

## 2024-01-04 DIAGNOSIS — R29898 Other symptoms and signs involving the musculoskeletal system: Secondary | ICD-10-CM | POA: Diagnosis not present

## 2024-01-04 DIAGNOSIS — M5416 Radiculopathy, lumbar region: Secondary | ICD-10-CM | POA: Diagnosis not present

## 2024-01-11 DIAGNOSIS — R29898 Other symptoms and signs involving the musculoskeletal system: Secondary | ICD-10-CM | POA: Diagnosis not present

## 2024-01-11 DIAGNOSIS — M5386 Other specified dorsopathies, lumbar region: Secondary | ICD-10-CM | POA: Diagnosis not present

## 2024-01-11 DIAGNOSIS — M5416 Radiculopathy, lumbar region: Secondary | ICD-10-CM | POA: Diagnosis not present

## 2024-01-11 DIAGNOSIS — R2689 Other abnormalities of gait and mobility: Secondary | ICD-10-CM | POA: Diagnosis not present

## 2024-01-12 DIAGNOSIS — M79671 Pain in right foot: Secondary | ICD-10-CM | POA: Diagnosis not present

## 2024-01-12 DIAGNOSIS — B351 Tinea unguium: Secondary | ICD-10-CM | POA: Diagnosis not present

## 2024-01-12 DIAGNOSIS — M79672 Pain in left foot: Secondary | ICD-10-CM | POA: Diagnosis not present

## 2024-01-18 DIAGNOSIS — M5386 Other specified dorsopathies, lumbar region: Secondary | ICD-10-CM | POA: Diagnosis not present

## 2024-01-18 DIAGNOSIS — R2689 Other abnormalities of gait and mobility: Secondary | ICD-10-CM | POA: Diagnosis not present

## 2024-01-18 DIAGNOSIS — R29898 Other symptoms and signs involving the musculoskeletal system: Secondary | ICD-10-CM | POA: Diagnosis not present

## 2024-01-18 DIAGNOSIS — M5416 Radiculopathy, lumbar region: Secondary | ICD-10-CM | POA: Diagnosis not present

## 2024-01-24 ENCOUNTER — Other Ambulatory Visit: Payer: Self-pay | Admitting: Internal Medicine

## 2024-01-26 DIAGNOSIS — M5416 Radiculopathy, lumbar region: Secondary | ICD-10-CM | POA: Diagnosis not present

## 2024-02-15 DIAGNOSIS — M5416 Radiculopathy, lumbar region: Secondary | ICD-10-CM | POA: Diagnosis not present

## 2024-02-15 DIAGNOSIS — M5386 Other specified dorsopathies, lumbar region: Secondary | ICD-10-CM | POA: Diagnosis not present

## 2024-02-15 DIAGNOSIS — R29898 Other symptoms and signs involving the musculoskeletal system: Secondary | ICD-10-CM | POA: Diagnosis not present

## 2024-02-15 DIAGNOSIS — R2689 Other abnormalities of gait and mobility: Secondary | ICD-10-CM | POA: Diagnosis not present

## 2024-02-20 DIAGNOSIS — M5416 Radiculopathy, lumbar region: Secondary | ICD-10-CM | POA: Diagnosis not present

## 2024-02-20 DIAGNOSIS — R2689 Other abnormalities of gait and mobility: Secondary | ICD-10-CM | POA: Diagnosis not present

## 2024-02-20 DIAGNOSIS — R29898 Other symptoms and signs involving the musculoskeletal system: Secondary | ICD-10-CM | POA: Diagnosis not present

## 2024-02-20 DIAGNOSIS — M5386 Other specified dorsopathies, lumbar region: Secondary | ICD-10-CM | POA: Diagnosis not present

## 2024-02-22 DIAGNOSIS — M5416 Radiculopathy, lumbar region: Secondary | ICD-10-CM | POA: Diagnosis not present

## 2024-02-22 DIAGNOSIS — R2689 Other abnormalities of gait and mobility: Secondary | ICD-10-CM | POA: Diagnosis not present

## 2024-02-22 DIAGNOSIS — M5386 Other specified dorsopathies, lumbar region: Secondary | ICD-10-CM | POA: Diagnosis not present

## 2024-02-22 DIAGNOSIS — R29898 Other symptoms and signs involving the musculoskeletal system: Secondary | ICD-10-CM | POA: Diagnosis not present

## 2024-02-25 ENCOUNTER — Other Ambulatory Visit: Payer: Self-pay | Admitting: Internal Medicine

## 2024-02-26 ENCOUNTER — Ambulatory Visit: Admitting: Internal Medicine

## 2024-02-26 ENCOUNTER — Encounter: Payer: Self-pay | Admitting: Internal Medicine

## 2024-05-25 ENCOUNTER — Other Ambulatory Visit: Payer: Self-pay | Admitting: Internal Medicine

## 2024-06-10 ENCOUNTER — Ambulatory Visit: Admitting: Internal Medicine

## 2024-11-28 ENCOUNTER — Ambulatory Visit
